# Patient Record
Sex: Male | Born: 1945
Health system: Southern US, Community
[De-identification: ages and names within clinical notes are randomized; demographics above are authoritative.]

## PROBLEM LIST (undated history)

## (undated) DIAGNOSIS — R4189 Other symptoms and signs involving cognitive functions and awareness: Secondary | ICD-10-CM

## (undated) DIAGNOSIS — I10 Essential (primary) hypertension: Secondary | ICD-10-CM

## (undated) DIAGNOSIS — I671 Cerebral aneurysm, nonruptured: Secondary | ICD-10-CM

## (undated) DIAGNOSIS — B888 Other specified infestations: Secondary | ICD-10-CM

## (undated) DIAGNOSIS — D649 Anemia, unspecified: Secondary | ICD-10-CM

## (undated) HISTORY — DX: Other specified infestations: B88.8

## (undated) HISTORY — PX: SHOULDER SURGERY: SHX246

## (undated) HISTORY — DX: Other symptoms and signs involving cognitive functions and awareness: R41.89

## (undated) HISTORY — DX: Anemia, unspecified: D64.9

## (undated) HISTORY — PX: OTHER SURGICAL HISTORY: SHX169

---

## 1998-06-21 ENCOUNTER — Inpatient Hospital Stay (HOSPITAL_COMMUNITY): Admission: EM | Admit: 1998-06-21 | Discharge: 1998-07-13 | Payer: Self-pay | Admitting: Emergency Medicine

## 1998-07-13 ENCOUNTER — Inpatient Hospital Stay (HOSPITAL_COMMUNITY)
Admission: RE | Admit: 1998-07-13 | Discharge: 1998-07-21 | Payer: Self-pay | Admitting: Physical Medicine and Rehabilitation

## 2002-01-18 ENCOUNTER — Emergency Department (HOSPITAL_COMMUNITY): Admission: EM | Admit: 2002-01-18 | Discharge: 2002-01-18 | Payer: Self-pay

## 2011-09-27 ENCOUNTER — Emergency Department (HOSPITAL_COMMUNITY)
Admission: EM | Admit: 2011-09-27 | Discharge: 2011-09-27 | Disposition: A | Discharge status: Home / Self Care | Payer: No Typology Code available for payment source | Attending: Emergency Medicine | Admitting: Emergency Medicine

## 2011-09-27 ENCOUNTER — Emergency Department (HOSPITAL_COMMUNITY): Payer: No Typology Code available for payment source

## 2011-09-27 DIAGNOSIS — I10 Essential (primary) hypertension: Secondary | ICD-10-CM | POA: Insufficient documentation

## 2011-09-27 DIAGNOSIS — M542 Cervicalgia: Secondary | ICD-10-CM | POA: Insufficient documentation

## 2011-09-27 DIAGNOSIS — Y92410 Unspecified street and highway as the place of occurrence of the external cause: Secondary | ICD-10-CM | POA: Insufficient documentation

## 2011-09-27 DIAGNOSIS — T1490XA Injury, unspecified, initial encounter: Secondary | ICD-10-CM | POA: Insufficient documentation

## 2011-09-27 DIAGNOSIS — M79609 Pain in unspecified limb: Secondary | ICD-10-CM | POA: Insufficient documentation

## 2012-05-07 ENCOUNTER — Other Ambulatory Visit: Payer: Self-pay | Admitting: Ophthalmology

## 2013-02-09 ENCOUNTER — Encounter (HOSPITAL_COMMUNITY): Payer: Self-pay | Admitting: *Deleted

## 2013-02-09 ENCOUNTER — Emergency Department (HOSPITAL_COMMUNITY)
Admission: EM | Admit: 2013-02-09 | Discharge: 2013-02-09 | Disposition: A | Discharge status: Home / Self Care | Payer: Medicare Other | Attending: Emergency Medicine | Admitting: Emergency Medicine

## 2013-02-09 DIAGNOSIS — R51 Headache: Secondary | ICD-10-CM

## 2013-02-09 DIAGNOSIS — Z79899 Other long term (current) drug therapy: Secondary | ICD-10-CM | POA: Insufficient documentation

## 2013-02-09 DIAGNOSIS — Z87898 Personal history of other specified conditions: Secondary | ICD-10-CM | POA: Insufficient documentation

## 2013-02-09 DIAGNOSIS — I10 Essential (primary) hypertension: Secondary | ICD-10-CM | POA: Insufficient documentation

## 2013-02-09 DIAGNOSIS — J321 Chronic frontal sinusitis: Secondary | ICD-10-CM

## 2013-02-09 HISTORY — DX: Essential (primary) hypertension: I10

## 2013-02-09 HISTORY — DX: Cerebral aneurysm, nonruptured: I67.1

## 2013-02-09 MED ORDER — IBUPROFEN 800 MG PO TABS: 800.0000 mg | ORAL_TABLET | Freq: Three times a day (TID) | ORAL | Status: DC

## 2013-02-09 MED ORDER — AMOXICILLIN-POT CLAVULANATE 875-125 MG PO TABS: 1.0000 | ORAL_TABLET | Freq: Two times a day (BID) | ORAL | Status: DC

## 2013-02-09 MED ORDER — PREDNISONE 20 MG PO TABS: ORAL_TABLET | ORAL | Status: DC

## 2013-02-09 MED ORDER — ACETAMINOPHEN 500 MG PO TABS
1000.0000 mg | ORAL_TABLET | Freq: Once | ORAL | Status: DC
  Filled 2013-02-09: qty 2

## 2013-02-09 MED ORDER — AMOXICILLIN-POT CLAVULANATE 875-125 MG PO TABS
1.0000 | ORAL_TABLET | Freq: Once | ORAL | Status: AC
  Administered 2013-02-09: 1 via ORAL
  Filled 2013-02-09: qty 1

## 2013-02-09 MED ORDER — IBUPROFEN 800 MG PO TABS
800.0000 mg | ORAL_TABLET | Freq: Once | ORAL | Status: AC
  Administered 2013-02-09: 800 mg via ORAL
  Filled 2013-02-09: qty 1

## 2013-02-09 NOTE — ED Provider Notes (Signed)
History     CSN: [id number]  Arrival date & time 02/09/13  1405   First MD Initiated Contact with Patient 02/09/13 1510      Chief Complaint  Patient presents with  . Headache    (Consider location/radiation/quality/duration/timing/severity/associated sxs/prior treatment) HPI.... frontal headache for 2 days with associated sinus congestion and greenish nasal discharge. severity is mild. Patient is taking nothing for it. No neuro deficits or stiff neck.  Past Medical History  Diagnosis Date  . Hypertension   . Brain aneurysm     Past Surgical History  Procedure Laterality Date  . Shunt placed in head    . Shoulder surgery      No family history on file.  History  Substance Use Topics  . Smoking status: Never Smoker   . Smokeless tobacco: Never Used  . Alcohol Use: No      Review of Systems  All other systems reviewed and are negative.    Allergies  Review of patient's allergies indicates no known allergies.  Home Medications   Current Outpatient Rx  Name  Route  Sig  Dispense  Refill  . hydrochlorothiazide (HYDRODIURIL) 25 MG tablet   Oral   Take 25 mg by mouth daily.         . quinapril (ACCUPRIL) 20 MG tablet   Oral   Take 20 mg by mouth at bedtime.         Marland Kitchen amoxicillin-clavulanate (AUGMENTIN) 875-125 MG per tablet   Oral   Take 1 tablet by mouth 2 (two) times daily. One po bid x 7 days   14 tablet   0   . ibuprofen (ADVIL,MOTRIN) 800 MG tablet   Oral   Take 1 tablet (800 mg total) by mouth 3 (three) times daily.   21 tablet   0   . predniSONE (DELTASONE) 20 MG tablet      3 tabs po day one, then 2 tabs daily x 4 days   11 tablet   0     BP 156/86  Pulse 59  Temp(Src) 98.6 F (37 C) (Oral)  Resp 18  SpO2 99%  Physical Exam  Nursing note and vitals reviewed. Constitutional: He is oriented to person, place, and time. He appears well-developed and well-nourished.  HENT:  Head: Normocephalic and atraumatic.  Tender  bilateral frontal sinuses  Eyes: Conjunctivae and EOM are normal. Pupils are equal, round, and reactive to light.  Neck: Normal range of motion. Neck supple.  Cardiovascular: Normal rate, regular rhythm and normal heart sounds.   Pulmonary/Chest: Effort normal and breath sounds normal.  Abdominal: Soft. Bowel sounds are normal.  Musculoskeletal: Normal range of motion.  Neurological: He is alert and oriented to person, place, and time.  Skin: Skin is warm and dry.  Psychiatric: He has a normal mood and affect.    ED Course  Procedures (including critical care time)  Labs Reviewed - No data to display No results found.   1. Frontal sinusitis   2. Headache       MDM  Suspect sinusitis is causing his headaches.  Rx Augmentin, ibuprofen, prednisone.        Donnetta Hutching, MD 02/09/13 1630

## 2013-02-09 NOTE — ED Notes (Signed)
Pt states started having a headache couple days ago, states has a shunt in his head and was concerned w/ headache, states also has had the "sniffles" and not sure if it's sinus pressure. Denies n/v, dizziness, blurred vision, or sensitivity to light or sound. Pt states forehead hurts and sometimes burns.

## 2013-02-09 NOTE — ED Notes (Signed)
Bed:WA17<BR> Expected date:<BR> Expected time:<BR> Means of arrival:<BR> Comments:<BR>

## 2013-06-22 ENCOUNTER — Encounter (HOSPITAL_COMMUNITY): Payer: Self-pay | Admitting: *Deleted

## 2013-06-22 ENCOUNTER — Emergency Department (HOSPITAL_COMMUNITY)
Admission: EM | Admit: 2013-06-22 | Discharge: 2013-06-22 | Disposition: A | Discharge status: Home / Self Care | Payer: Medicare Other | Source: Home / Self Care | Attending: Emergency Medicine | Admitting: Emergency Medicine

## 2013-06-22 DIAGNOSIS — W57XXXA Bitten or stung by nonvenomous insect and other nonvenomous arthropods, initial encounter: Secondary | ICD-10-CM

## 2013-06-22 DIAGNOSIS — T148 Other injury of unspecified body region: Secondary | ICD-10-CM

## 2013-06-22 MED ORDER — DOXYCYCLINE HYCLATE 100 MG PO TABS: 100.0000 mg | ORAL_TABLET | Freq: Two times a day (BID) | ORAL | Status: DC

## 2013-06-22 NOTE — ED Notes (Signed)
Pt states  He  Removed  A  Tick  From  r  Side  Bout  6  Days  Ago     Denys  Any  Fever  Or  Body  Aches   No rash other  Some   Irritation  Around  The  Bite

## 2013-06-22 NOTE — ED Provider Notes (Signed)
Chief Complaint:   Chief Complaint  Patient presents with  . Tick Removal    History of Present Illness:   Wesley Webb is a 67 year old male who was bitten by a tick on the right lateral chest about a week ago. At first he thought this was a mole. He put some alcohol on it and then a tick fell off. At the site of the tick bite the he's had some swelling and tenderness to palpation. It's mildly pruritic. He denies any purulent drainage. He's had no fever, chills, headache, stiff neck, muscle aches, joint pain, or generalized rash.  Review of Systems:  Other than noted above, the patient denies any of the following symptoms: Systemic:  No fever, chills, sweats, weight loss, or fatigue. ENT:  No nasal congestion, rhinorrhea, sore throat, swelling of lips, tongue or throat. Resp:  No cough, wheezing, or shortness of breath. Skin:  No rash, itching, nodules, or suspicious lesions.  PMFSH:  Past medical history, family history, social history, meds, and allergies were reviewed. He takes Norvasc and Accupril for high blood pressure.  Physical Exam:   Vital signs:  BP 183/89  Pulse 52  Temp(Src) 98.3 F (36.8 C) (Oral)  Resp 16  SpO2 100% Gen:  Alert, oriented, in no distress. ENT:  Pharynx clear, no intraoral lesions, moist mucous membranes. Lungs:  Clear to auscultation. Skin:  There was a 1 cm, tender nodule on the right lateral chest area. There was no purulent drainage. No visible mouthparts. Skin was otherwise clear.  Assessment:  The encounter diagnosis was Tick bite.  Appears to have mild local infection. No evidence of systemic disease.  Plan:   1.  The following meds were prescribed:   Discharge Medication List as of 06/22/2013  2:19 PM    START taking these medications   Details  doxycycline (VIBRA-TABS) 100 MG tablet Take 1 tablet (100 mg total) by mouth 2 (two) times daily., Starting 06/22/2013, Until Discontinued, Normal       2.  The patient was instructed in  symptomatic care and handouts were given. 3.  The patient was told to return if becoming worse in any way, if no better in 3 or 4 days, and given some red flag symptoms such as fever, headache, joint pain, muscle aches, or generalized rash that would indicate earlier return. 4.  Follow up here or with his primary care physician as needed.     Reuben Likes, MD 06/22/13 2034

## 2014-07-01 ENCOUNTER — Other Ambulatory Visit: Payer: Self-pay | Admitting: Family

## 2014-07-01 ENCOUNTER — Ambulatory Visit
Admission: RE | Admit: 2014-07-01 | Discharge: 2014-07-01 | Disposition: A | Discharge status: Home / Self Care | Payer: Medicare Other | Source: Ambulatory Visit | Attending: Family | Admitting: Family

## 2014-07-01 DIAGNOSIS — R109 Unspecified abdominal pain: Secondary | ICD-10-CM

## 2014-08-14 ENCOUNTER — Encounter (HOSPITAL_COMMUNITY): Payer: Self-pay | Admitting: Emergency Medicine

## 2014-08-14 ENCOUNTER — Emergency Department (HOSPITAL_COMMUNITY): Payer: Medicare Other

## 2014-08-14 ENCOUNTER — Emergency Department (HOSPITAL_COMMUNITY)
Admission: EM | Admit: 2014-08-14 | Discharge: 2014-08-14 | Disposition: A | Discharge status: Home / Self Care | Payer: Medicare Other | Attending: Emergency Medicine | Admitting: Emergency Medicine

## 2014-08-14 DIAGNOSIS — Z791 Long term (current) use of non-steroidal anti-inflammatories (NSAID): Secondary | ICD-10-CM | POA: Diagnosis not present

## 2014-08-14 DIAGNOSIS — M545 Low back pain, unspecified: Secondary | ICD-10-CM | POA: Insufficient documentation

## 2014-08-14 DIAGNOSIS — I10 Essential (primary) hypertension: Secondary | ICD-10-CM | POA: Insufficient documentation

## 2014-08-14 DIAGNOSIS — Z79899 Other long term (current) drug therapy: Secondary | ICD-10-CM | POA: Diagnosis not present

## 2014-08-14 DIAGNOSIS — IMO0002 Reserved for concepts with insufficient information to code with codable children: Secondary | ICD-10-CM | POA: Insufficient documentation

## 2014-08-14 DIAGNOSIS — Z792 Long term (current) use of antibiotics: Secondary | ICD-10-CM | POA: Diagnosis not present

## 2014-08-14 DIAGNOSIS — M5442 Lumbago with sciatica, left side: Secondary | ICD-10-CM

## 2014-08-14 LAB — URINALYSIS, ROUTINE W REFLEX MICROSCOPIC
Bilirubin Urine: NEGATIVE
Glucose, UA: NEGATIVE mg/dL
KETONES UR: NEGATIVE mg/dL
LEUKOCYTES UA: NEGATIVE
NITRITE: NEGATIVE
PROTEIN: NEGATIVE mg/dL
Specific Gravity, Urine: 1.026 (ref 1.005–1.030)
UROBILINOGEN UA: 0.2 mg/dL (ref 0.0–1.0)
pH: 5.5 (ref 5.0–8.0)

## 2014-08-14 LAB — URINE MICROSCOPIC-ADD ON

## 2014-08-14 MED ORDER — NAPROXEN 500 MG PO TABS: 500.0000 mg | ORAL_TABLET | Freq: Two times a day (BID) | ORAL | Status: DC

## 2014-08-14 MED ORDER — HYDROCODONE-ACETAMINOPHEN 5-325 MG PO TABS
1.0000 | ORAL_TABLET | Freq: Once | ORAL | Status: AC
  Administered 2014-08-14: 1 via ORAL
  Filled 2014-08-14: qty 1

## 2014-08-14 MED ORDER — DIAZEPAM 5 MG PO TABS: 5.0000 mg | ORAL_TABLET | Freq: Two times a day (BID) | ORAL | Status: DC

## 2014-08-14 NOTE — ED Provider Notes (Signed)
CSN: [id number]     Arrival date & time 08/14/14  1117 History   This chart was scribed for non-physician practitioner Wynetta EmeryNicole Kirah Stice, PA-C, working with Samuel JesterKathleen McManus, DO, by Yevette EdwardsAngela Bracken, ED Scribe. This patient was seen in room TR09C/TR09C and the patient's care was started at 12:42 PM.   First MD Initiated Contact with Patient 08/14/14 1128     Chief Complaint  Patient presents with  . Back Pain    HPI HPI Comments: Wesley Webb is a 68 y.o. male, with a h/o HTN, who presents to the Emergency Department complaining of lower left-sided back pain which has persisted for a week. He rates the pain as 7/10, and he reports the pain is increased after sitting or bearing weight.  Pain radiates down the posterior side just above the knee on the left side. Patient denies fever, numbness, weakness, paresthesia, difficulty ambulating, history of IV drug use, history of cancer, saddle anesthesia  Past Medical History  Diagnosis Date  . Hypertension   . Brain aneurysm    Past Surgical History  Procedure Laterality Date  . Shunt placed in head    . Shoulder surgery     History reviewed. No pertinent family history. History  Substance Use Topics  . Smoking status: Never Smoker   . Smokeless tobacco: Never Used  . Alcohol Use: No    Review of Systems  A complete 10 system review of systems was obtained, and all systems were negative except where indicated in the HPI and PE.    Allergies  Review of patient's allergies indicates no known allergies.  Home Medications   Prior to Admission medications   Medication Sig Start Date End Date Taking? Authorizing Provider  amoxicillin-clavulanate (AUGMENTIN) 875-125 MG per tablet Take 1 tablet by mouth 2 (two) times daily. One po bid x 7 days 02/09/13   Donnetta HutchingBrian Cook, MD  doxycycline (VIBRA-TABS) 100 MG tablet Take 1 tablet (100 mg total) by mouth 2 (two) times daily. 06/22/13   Reuben Likesavid C Keller, MD  hydrochlorothiazide (HYDRODIURIL) 25 MG  tablet Take 25 mg by mouth daily.    Historical Provider, MD  ibuprofen (ADVIL,MOTRIN) 800 MG tablet Take 1 tablet (800 mg total) by mouth 3 (three) times daily. 02/09/13   Donnetta HutchingBrian Cook, MD  predniSONE (DELTASONE) 20 MG tablet 3 tabs po day one, then 2 tabs daily x 4 days 02/09/13   Donnetta HutchingBrian Cook, MD  quinapril (ACCUPRIL) 20 MG tablet Take 20 mg by mouth at bedtime.    Historical Provider, MD   Triage Vitals: BP 142/90  Pulse 61  Temp(Src) 98.7 F (37.1 C) (Oral)  Resp 18  Ht 5\' 10"  (1.778 m)  Wt 245 lb (111.131 kg)  BMI 35.15 kg/m2  SpO2 97%  Physical Exam  Nursing note and vitals reviewed. Constitutional: He appears well-developed and well-nourished.  HENT:  Head: Normocephalic.  Eyes: Conjunctivae are normal.  Neck: Normal range of motion.  Cardiovascular: Normal rate, regular rhythm and intact distal pulses.   Pulmonary/Chest: Effort normal.  Abdominal: Soft. There is no tenderness.  Genitourinary:  No CVA tenderness palpation bilaterally.  Neurological: He is alert.  No point tenderness to percussion of lumbar spinal processes.  No TTP or paraspinal muscular spasm. Strength is 5 out of 5 to bilateral lower extremities at hip and knee; extensor hallucis longus 5 out of 5. Ankle strength 5 out of 5, no clonus, neurovascularly intact. No saddle anaesthesia. Patellar reflexes are 2+ bilaterally.     Straight leg  raise is negative bilaterally  Psychiatric: He has a normal mood and affect.    ED Course  Procedures (including critical care time)  DIAGNOSTIC STUDIES: Oxygen Saturation is 97% on room air, normal by my interpretation.    COORDINATION OF CARE:  12:42 PM- Discussed treatment plan with patient, and the patient agreed to the plan.   Labs Review Labs Reviewed  URINALYSIS, ROUTINE W REFLEX MICROSCOPIC    Imaging Review Dg Lumbar Spine Complete  08/14/2014   CLINICAL DATA:  Chronic back pain  EXAM: LUMBAR SPINE - COMPLETE 4+ VIEW  COMPARISON:  None.  FINDINGS:  Osseous demineralization. Five lumbar vertebrae.  Mild multilevel disk space narrowing and endplate spur formation.  Vertebral body heights maintained.  Limbus vertebra at L4.  No acute fracture, subluxation, or bone destruction.  No spondylolysis.  SI joints symmetric.  Tube projects over the LEFT mid abdomen.  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  No acute abnormalities.   Electronically Signed   By: Ulyses Southward M.D.   On: 08/14/2014 13:08     EKG Interpretation None      MDM   Final diagnoses:  None   Filed Vitals:   08/14/14 1122 08/14/14 1353  BP: 142/90 131/76  Pulse: 61 60  Temp: 98.7 F (37.1 C) 98.9 F (37.2 C)  TempSrc: Oral Oral  Resp: 18 18  Height: 5\' 10"  (1.778 m)   Weight: 245 lb (111.131 kg)   SpO2: 97% 99%    Medications  HYDROcodone-acetaminophen (NORCO/VICODIN) 5-325 MG per tablet 1 tablet (1 tablet Oral Given 08/14/14 1315)    Wesley Webb is a 68 y.o. male presenting with  back pain.  No neurological deficits and normal neuro exam. X-ray with no acute abnormalities, urinalysis is not consistent with infection. Patient can walk but states is painful.  No loss of bowel or bladder control.  No concern for cauda equina.  No fever, night sweats, weight loss, h/o cancer, IVDU.  RICE protocol and pain medicine indicated and discussed with patient.  Evaluation does not show pathology that would require ongoing emergent intervention or inpatient treatment. Pt is hemodynamically stable and mentating appropriately. Discussed findings and plan with patient/guardian, who agrees with care plan. All questions answered. Return precautions discussed and outpatient follow up given.   Discharge Medication List as of 08/14/2014  1:46 PM    START taking these medications   Details  diazepam (VALIUM) 5 MG tablet Take 1 tablet (5 mg total) by mouth 2 (two) times daily., Starting 08/14/2014, Until Discontinued, Print    naproxen  (NAPROSYN) 500 MG tablet Take 1 tablet (500 mg total) by mouth 2 (two) times daily with a meal., Starting 08/14/2014, Until Discontinued, Print        I personally performed the services described in this documentation, which was scribed in my presence. The recorded information has been reviewed and is accurate.    Wynetta Emery, PA-C 08/15/14 1007

## 2014-08-14 NOTE — ED Notes (Signed)
Per pt sts lower left sided back pain x 1 week. sts worse after sitting and bearing weight.

## 2014-08-14 NOTE — Discharge Instructions (Signed)
For pain control you may take up to naproxen 2 times a day with meals.  Take valium for breakthrough pain, do not drink alcohol, drive, care for children or do other critical tasks while taking valium.  Please be very careful not to fall! The pain medication  puts you at risk for falls. Please rest as much as possible and try to not stay alone.   Please follow with your primary care doctor in the next 2 days for a check-up. They must obtain records for further management.   Do not hesitate to return to the Emergency Department for any new, worsening or concerning symptoms.    Back Pain, Adult Back pain is very common. The pain often gets better over time. The cause of back pain is usually not dangerous. Most people can learn to manage their back pain on their own.  HOME CARE   Stay active. Start with short walks on flat ground if you can. Try to walk farther each day.  Do not sit, drive, or stand in one place for more than 30 minutes. Do not stay in bed.  Do not avoid exercise or work. Activity can help your back heal faster.  Be careful when you bend or lift an object. Bend at your knees, keep the object close to you, and do not twist.  Sleep on a firm mattress. Lie on your side, and bend your knees. If you lie on your back, put a pillow under your knees.  Only take medicines as told by your doctor.  Put ice on the injured area.  Put ice in a plastic bag.  Place a towel between your skin and the bag.  Leave the ice on for 15-20 minutes, 03-04 times a day for the first 2 to 3 days. After that, you can switch between ice and heat packs.  Ask your doctor about back exercises or massage.  Avoid feeling anxious or stressed. Find good ways to deal with stress, such as exercise. GET HELP RIGHT AWAY IF:   Your pain does not go away with rest or medicine.  Your pain does not go away in 1 week.  You have new problems.  You do not feel well.  The pain spreads into your  legs.  You cannot control when you poop (bowel movement) or pee (urinate).  Your arms or legs feel weak or lose feeling (numbness).  You feel sick to your stomach (nauseous) or throw up (vomit).  You have belly (abdominal) pain.  You feel like you may pass out (faint). MAKE SURE YOU:   Understand these instructions.  Will watch your condition.  Will get help right away if you are not doing well or get worse. Document Released: 05/30/2008 Document Revised: 03/05/2012 Document Reviewed: 04/15/2014 [facility] Patient Information 2015 [employer] [place]. This information is not intended to replace advice given to you by your health care provider. Make sure you discuss any questions you have with your health care provider.

## 2014-08-16 NOTE — ED Provider Notes (Signed)
Medical screening examination/treatment/procedure(s) were performed by non-physician practitioner and as supervising physician I was immediately available for consultation/collaboration.   EKG Interpretation None        Jermie Hippe, DO 08/16/14 1555 

## 2014-08-18 ENCOUNTER — Other Ambulatory Visit: Payer: Self-pay | Admitting: Family

## 2014-08-18 DIAGNOSIS — I158 Other secondary hypertension: Secondary | ICD-10-CM

## 2014-08-18 DIAGNOSIS — R1084 Generalized abdominal pain: Secondary | ICD-10-CM

## 2015-02-10 DIAGNOSIS — M5432 Sciatica, left side: Secondary | ICD-10-CM | POA: Diagnosis not present

## 2015-02-10 DIAGNOSIS — I10 Essential (primary) hypertension: Secondary | ICD-10-CM | POA: Diagnosis not present

## 2015-02-10 DIAGNOSIS — Z79899 Other long term (current) drug therapy: Secondary | ICD-10-CM | POA: Diagnosis not present

## 2016-06-16 IMAGING — CR DG LUMBAR SPINE COMPLETE 4+V
5 series · 5 of 5 positions shown · non-contrast
Comparison: None.

CLINICAL DATA: Chronic back pain

EXAM:
LUMBAR SPINE - COMPLETE 4+ VIEW

[t l-spine a.p.]
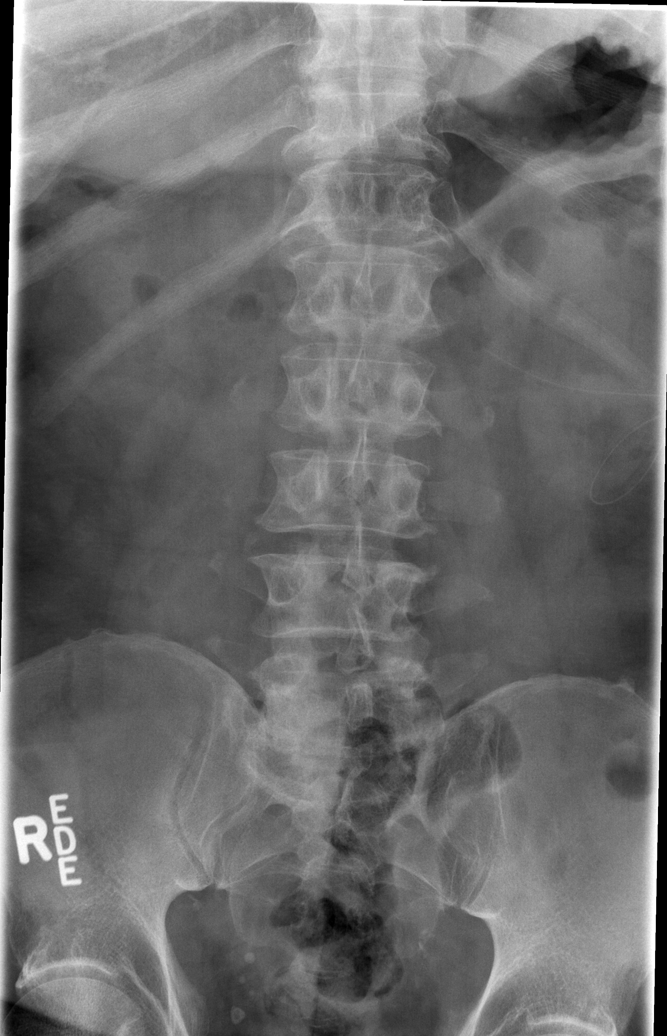

[t l-spine oblique exposure (1 of 2)]
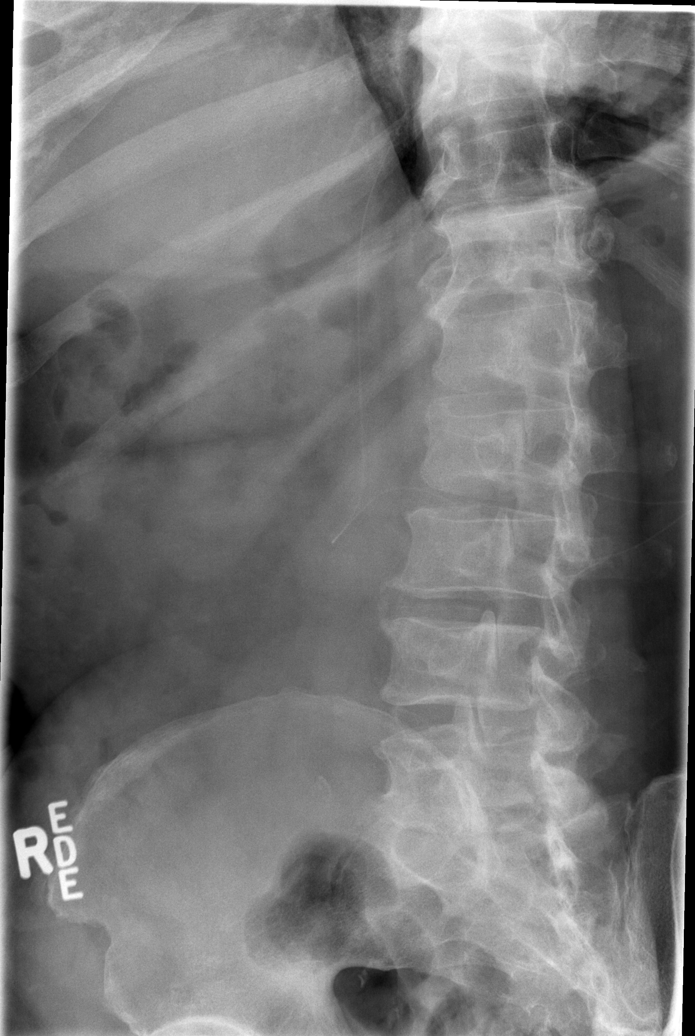

[t l-spine oblique exposure (2 of 2)]
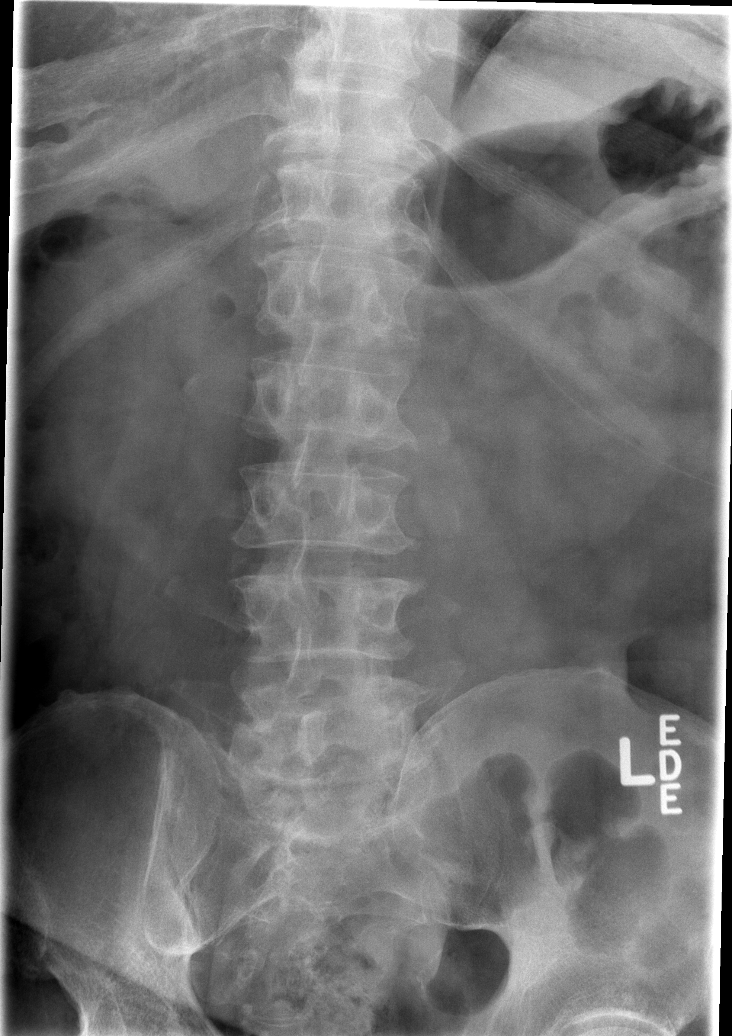

[t l-spine lat]
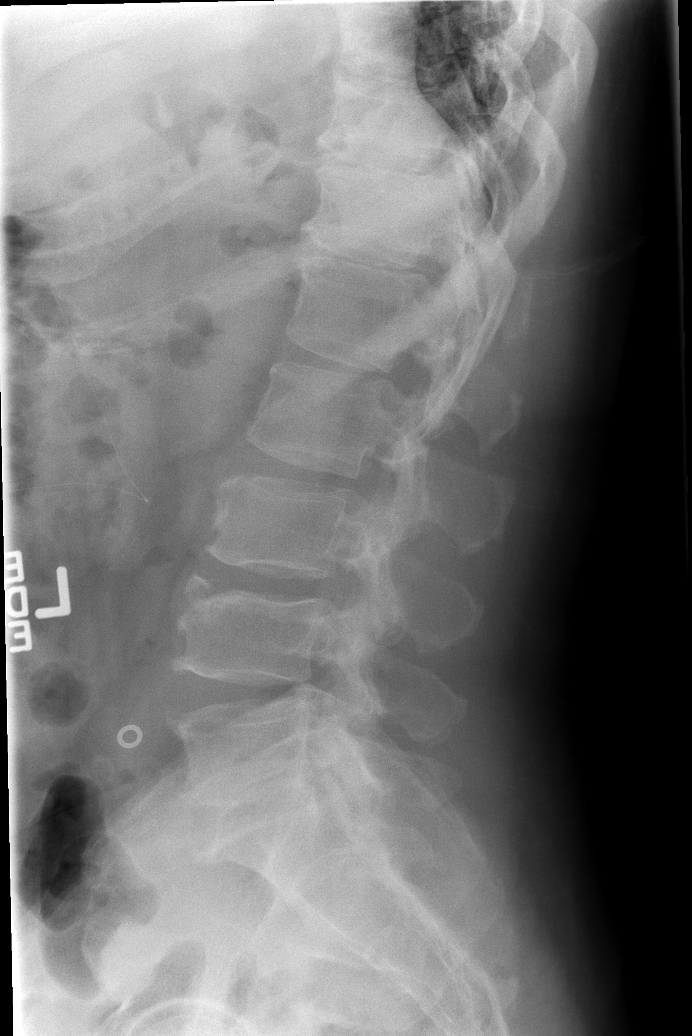

[t l-spine l5-s1 spot]
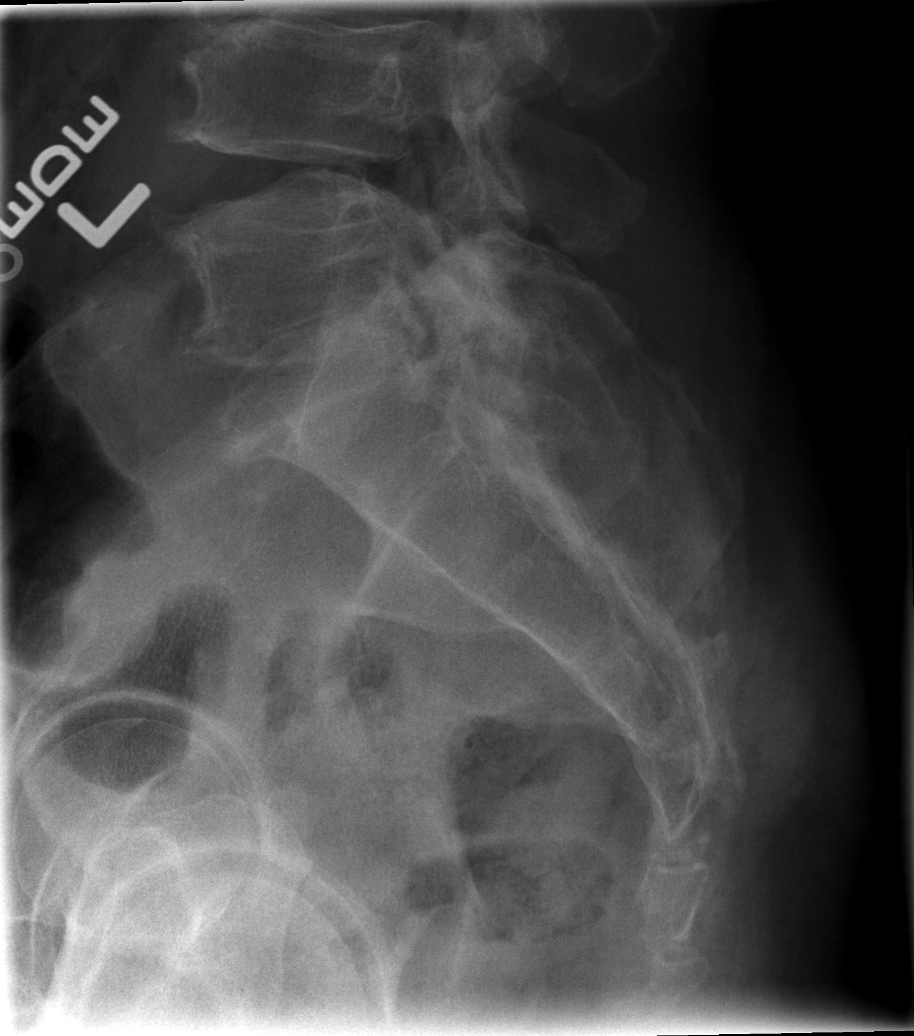

[5 of 5 positions shown; findings below may reference images not displayed]

FINDINGS: Osseous demineralization.
Five lumbar vertebrae.

Mild multilevel disk space narrowing and endplate spur formation.

Vertebral body heights maintained.

Limbus vertebra at L4.

No acute fracture, subluxation, or bone destruction.

No spondylolysis.

SI joints symmetric.

Tube projects over the LEFT mid abdomen.
IMPRESSION: Degenerative disc disease changes lumbar spine with limbus vertebra
at L4.

No acute abnormalities.

## 2017-05-05 DIAGNOSIS — R319 Hematuria, unspecified: Secondary | ICD-10-CM | POA: Diagnosis not present

## 2017-05-05 DIAGNOSIS — I10 Essential (primary) hypertension: Secondary | ICD-10-CM | POA: Diagnosis not present

## 2017-05-05 DIAGNOSIS — M940 Chondrocostal junction syndrome [Tietze]: Secondary | ICD-10-CM | POA: Diagnosis not present

## 2017-05-05 DIAGNOSIS — E782 Mixed hyperlipidemia: Secondary | ICD-10-CM | POA: Diagnosis not present

## 2017-05-16 DIAGNOSIS — H50111 Monocular exotropia, right eye: Secondary | ICD-10-CM | POA: Diagnosis not present

## 2017-05-16 DIAGNOSIS — H401133 Primary open-angle glaucoma, bilateral, severe stage: Secondary | ICD-10-CM | POA: Diagnosis not present

## 2017-09-12 DIAGNOSIS — H47233 Glaucomatous optic atrophy, bilateral: Secondary | ICD-10-CM | POA: Diagnosis not present

## 2017-10-04 DIAGNOSIS — Z0000 Encounter for general adult medical examination without abnormal findings: Secondary | ICD-10-CM | POA: Diagnosis not present

## 2017-10-04 DIAGNOSIS — Z125 Encounter for screening for malignant neoplasm of prostate: Secondary | ICD-10-CM | POA: Diagnosis not present

## 2017-10-04 DIAGNOSIS — I10 Essential (primary) hypertension: Secondary | ICD-10-CM | POA: Diagnosis not present

## 2017-10-04 DIAGNOSIS — M545 Low back pain: Secondary | ICD-10-CM | POA: Diagnosis not present

## 2017-10-04 DIAGNOSIS — E782 Mixed hyperlipidemia: Secondary | ICD-10-CM | POA: Diagnosis not present

## 2017-10-04 DIAGNOSIS — Z1211 Encounter for screening for malignant neoplasm of colon: Secondary | ICD-10-CM | POA: Diagnosis not present

## 2018-01-17 DIAGNOSIS — H401133 Primary open-angle glaucoma, bilateral, severe stage: Secondary | ICD-10-CM | POA: Diagnosis not present

## 2018-04-18 DIAGNOSIS — H47233 Glaucomatous optic atrophy, bilateral: Secondary | ICD-10-CM | POA: Diagnosis not present

## 2018-07-09 LAB — CBC AND DIFFERENTIAL
HEMATOCRIT: 38 — AB (ref 41–53)
WBC: 6.4

## 2018-07-09 LAB — LIPID PANEL
Cholesterol: 191 (ref 0–200)
HDL: 37 (ref 35–70)
LDL CALC: 92
LDL/HDL RATIO: 2.5
TRIGLYCERIDES: 308 — AB (ref 40–160)

## 2018-07-09 LAB — HEPATIC FUNCTION PANEL
ALT: 26 (ref 10–40)
AST: 20 (ref 14–40)
Alkaline Phosphatase: 54 (ref 25–125)

## 2018-07-09 LAB — BASIC METABOLIC PANEL
BUN: 14 (ref 4–21)
CREATININE: 1.5 — AB (ref 0.6–1.3)
Glucose: 134
Potassium: 4.3 (ref 3.4–5.3)
SODIUM: 141 (ref 137–147)

## 2018-09-17 ENCOUNTER — Encounter: Payer: Self-pay | Admitting: Nurse Practitioner

## 2018-09-17 DIAGNOSIS — I10 Essential (primary) hypertension: Secondary | ICD-10-CM

## 2018-09-17 DIAGNOSIS — E782 Mixed hyperlipidemia: Secondary | ICD-10-CM

## 2018-10-11 ENCOUNTER — Encounter: Payer: Self-pay | Admitting: Nurse Practitioner

## 2018-10-11 ENCOUNTER — Ambulatory Visit: Payer: Self-pay

## 2018-11-23 ENCOUNTER — Other Ambulatory Visit: Payer: Self-pay

## 2018-11-23 NOTE — Patient Outreach (Signed)
[facility] Care Management  11/23/2018  Shannan HarperFreddie L Mccraw 01/01/1946 [id number]   Medication Adherence call to Mr. Wesley ManlyFreddie Whitehair left a message with patient's wife she ask to call back after 6pm. patient is due on Quanipril 20 mg. Mr. Thamas JaegersLennon is showing past due under [employer].   Lillia AbedAna Ollison-Moran CPhT Pharmacy Technician [facility] Direct Dial [phone]  Fax [phone] [email or web address]

## 2018-12-31 ENCOUNTER — Telehealth: Payer: Self-pay

## 2018-12-31 NOTE — Telephone Encounter (Signed)
Called pt to schedule AWV-S.  Wife states he is not at home.  Left message to call Misty Stanley at [phone].  Last awv 10/04/17

## 2019-01-02 ENCOUNTER — Other Ambulatory Visit: Payer: Self-pay | Admitting: Nurse Practitioner

## 2019-01-04 ENCOUNTER — Other Ambulatory Visit: Payer: Self-pay

## 2019-01-04 MED ORDER — AMLODIPINE BESYLATE 10 MG PO TABS: 10.0000 mg | ORAL_TABLET | Freq: Every day | ORAL | 0 refills | Status: DC

## 2019-02-07 NOTE — Telephone Encounter (Signed)
Called on Home phone # and Mobile# to schedule Medicare Annual Wellness Visit with the Nurse Health Advisor. No answer.  Left message on Mobile voicemail, couldn't leave a message at Home Phone number.    If patient returns call, please note: their last AWV was on 10/04/2017,18 please schedule AWV with NHA any date AFTER 10/04/2018.  Thank you! For any questions please contact: Trixie Rude, Care Guide at [phone] or Skype [email or web address]

## 2019-02-27 ENCOUNTER — Telehealth: Payer: Self-pay

## 2019-02-27 NOTE — Telephone Encounter (Signed)
Spoke with patient and scheduled his Medicare Annual Wellness Visit for 03/14/2019 at 9:15 am with NHA, AND an Office Visit at 10:00 am with Clarisse Gouge.  Last date of service seen per [employer] was 10/04/2017.  Patient states he is still seeing [facility] as his PCP.  Please note his Last AWV was 10/04/2017, per [employer] and [facility].  Trixie Rude, Care Guide.

## 2019-02-27 NOTE — Telephone Encounter (Signed)
Called patient to see if he is still seeing [facility] as his PCP, and to schedule Medicare Annual Wellness Visit with the Nurse Health Advisor. Per [employer], patient's last DOS was 10/04/2017.  Patient states he is still seeing [facility] as his PCP.  Patient scheduled his Medicare Annual Wellness Visit with NHA on 03/14/2019 at 9:15 am, and Office Visit at 10:00 am with Clarisse Gouge. Thank you! For any questions please contact: Trixie Rude at [phone] or Skype [email or web address]

## 2019-03-06 ENCOUNTER — Other Ambulatory Visit: Payer: Self-pay | Admitting: Nurse Practitioner

## 2019-03-13 ENCOUNTER — Telehealth: Payer: Self-pay | Admitting: Nurse Practitioner

## 2019-03-13 NOTE — Telephone Encounter (Signed)
PT LVM WANTING TO VERIFY NEXT APPT NO ANS LVM ADV PT OF APPT INFO FOR 3/19@915 

## 2019-03-14 ENCOUNTER — Other Ambulatory Visit: Payer: Self-pay

## 2019-03-14 ENCOUNTER — Ambulatory Visit (INDEPENDENT_AMBULATORY_CARE_PROVIDER_SITE_OTHER): Payer: Medicare Other

## 2019-03-14 ENCOUNTER — Encounter: Payer: Self-pay | Admitting: Internal Medicine

## 2019-03-14 ENCOUNTER — Other Ambulatory Visit: Payer: Self-pay | Admitting: Internal Medicine

## 2019-03-14 ENCOUNTER — Ambulatory Visit (INDEPENDENT_AMBULATORY_CARE_PROVIDER_SITE_OTHER): Payer: Medicare Other | Admitting: Internal Medicine

## 2019-03-14 VITALS — BP 132/74 | HR 64 | Temp 98.3°F | Ht 67.8 in | Wt 234.6 lb

## 2019-03-14 DIAGNOSIS — Z0000 Encounter for general adult medical examination without abnormal findings: Secondary | ICD-10-CM

## 2019-03-14 DIAGNOSIS — I10 Essential (primary) hypertension: Secondary | ICD-10-CM

## 2019-03-14 DIAGNOSIS — E782 Mixed hyperlipidemia: Secondary | ICD-10-CM | POA: Diagnosis not present

## 2019-03-14 DIAGNOSIS — R81 Glycosuria: Secondary | ICD-10-CM

## 2019-03-14 DIAGNOSIS — R631 Polydipsia: Secondary | ICD-10-CM | POA: Diagnosis not present

## 2019-03-14 LAB — POCT URINALYSIS DIPSTICK
Bilirubin, UA: NEGATIVE
Blood, UA: NEGATIVE
GLUCOSE UA: POSITIVE — AB
KETONES UA: NEGATIVE
LEUKOCYTES UA: NEGATIVE
NITRITE UA: NEGATIVE
PROTEIN UA: NEGATIVE
Spec Grav, UA: 1.025 (ref 1.010–1.025)
Urobilinogen, UA: 0.2 E.U./dL
pH, UA: 5 (ref 5.0–8.0)

## 2019-03-14 NOTE — Progress Notes (Addendum)
Subjective:     Patient ID: Wesley Webb , male    DOB: [birth year] , 73 y.o.   MRN: [id number]   Chief Complaint  Patient presents with  . Hypertension    HPI  Pt is here for HTN FU. He rarely check his BP. Would like his L ear checked, sometimes feels there's something in it.     Past Medical History:  Diagnosis Date  . Brain aneurysm   . Hypertension      Family History  Problem Relation Age of Onset  . Glaucoma Mother      Current Outpatient Medications:  .  amLODipine (NORVASC) 10 MG tablet, Take 1 tablet (10 mg total) by mouth daily. Patient needs an appointment, Disp: 30 tablet, Rfl: 0 .  amoxicillin-clavulanate (AUGMENTIN) 875-125 MG per tablet, Take 1 tablet by mouth 2 (two) times daily. One po bid x 7 days (Patient not taking: Reported on 03/14/2019), Disp: 14 tablet, Rfl: 0 .  COMBIGAN 0.2-0.5 % ophthalmic solution, , Disp: , Rfl:  .  cyclobenzaprine (FLEXERIL) 5 MG tablet, Take 5 mg by mouth 3 (three) times daily as needed for muscle spasms., Disp: , Rfl:  .  diazepam (VALIUM) 5 MG tablet, Take 1 tablet (5 mg total) by mouth 2 (two) times daily. (Patient not taking: Reported on 03/14/2019), Disp: 10 tablet, Rfl: 0 .  doxycycline (VIBRA-TABS) 100 MG tablet, Take 1 tablet (100 mg total) by mouth 2 (two) times daily. (Patient not taking: Reported on 03/14/2019), Disp: 20 tablet, Rfl: 0 .  hydrochlorothiazide (HYDRODIURIL) 25 MG tablet, Take 25 mg by mouth daily., Disp: , Rfl:  .  hydrocortisone cream 1 %, Apply 1 application topically 2 (two) times daily., Disp: , Rfl:  .  ibuprofen (ADVIL,MOTRIN) 800 MG tablet, Take 1 tablet (800 mg total) by mouth 3 (three) times daily., Disp: 21 tablet, Rfl: 0 .  LUMIGAN 0.01 % SOLN, INSTILL 1 DROP INTO EACH EYE EVERY EVENING, Disp: , Rfl:  .  naproxen (NAPROSYN) 500 MG tablet, Take 1 tablet (500 mg total) by mouth 2 (two) times daily with a meal., Disp: 30 tablet, Rfl: 0 .  predniSONE (DELTASONE) 20 MG tablet, 3 tabs po day one,  then 2 tabs daily x 4 days (Patient not taking: Reported on 03/14/2019), Disp: 11 tablet, Rfl: 0 .  quinapril (ACCUPRIL) 20 MG tablet, Take 20 mg by mouth at bedtime., Disp: , Rfl:  .  Testosterone 20.25 MG/ACT (1.62%) GEL, , Disp: , Rfl:    No Known Allergies   Review of Systems  Constitutional: Negative for diaphoresis.  HENT: Negative for congestion, ear discharge, ear pain and sore throat.   Respiratory: Negative for cough, chest tightness and shortness of breath.   Cardiovascular: Negative for chest pain, palpitations and leg swelling.  Gastrointestinal: Negative for constipation, diarrhea and nausea.  Endocrine: Positive for polydipsia.       Admits of eating a lot of sweets, cookies or candy, but plans to stop today  Genitourinary: Negative for dysuria.  Musculoskeletal: Negative for arthralgias.  Skin: Negative for rash.  Neurological: Negative for weakness and headaches.     Today's Vitals   03/14/19 0955  BP: 132/74  Pulse: 64  Temp: 98.3 F (36.8 C)  TempSrc: Oral  Weight: 234 lb 9.6 oz (106.4 kg)  Height: 5' 7.8" (1.722 m)   Body mass index is 35.88 kg/m.   Objective:  Physical Exam  Constitutional: She is oriented to person, place, and time. She appears well-developed  and well-nourished. No distress.  HENT:  Head: Normocephalic and atraumatic.  Right Ear: External ear normal.  Left Ear: External ear normal.  Nose: Nose normal.  Eyes: Conjunctivae are normal. Right eye exhibits no discharge. Left eye exhibits no discharge. No scleral icterus.  Neck: Neck supple. No thyromegaly present.  No carotid bruits bilaterally  Cardiovascular: Normal rate and regular rhythm.  No murmur heard. Pulmonary/Chest: Effort normal and breath sounds normal. No respiratory distress.  Musculoskeletal: Normal range of motion. She exhibits no edema.  Lymphadenopathy:    She has no cervical adenopathy.  Neurological: She is alert and oriented to person, place, and time.  Skin:  Skin is warm and dry. Capillary refill takes less than 2 seconds. No rash noted. She is not diaphoretic.  Psychiatric: She has a normal mood and affect. Her behavior is normal. Judgment and thought content normal.  Nursing note reviewed. UA- shows + 1 glucose.  Assessment And Plan:    .1. Essential hypertension- stable. May continue same meds.  - CMP14 + Anion Gap - CBC no Diff  2. Mixed hyperlipidemia- chronic - Lipid Profile  3. Polydipsia- new.  - Hemoglobin A1c 4- Glycosuria- new. HGBA1C ordered. Advised to take off sweets and gradually cut them out of his diet. Explained that fruits also have sucrose, so this can raise his blood sugar.    FU with Janece in 3-4 months.   Auri Jahnke RODRIGUEZ-SOUTHWORTH, PA-C

## 2019-03-14 NOTE — Patient Instructions (Signed)
Wesley Webb , Thank you for taking time to come for your Medicare Wellness Visit. I appreciate your ongoing commitment to your health goals. Please review the following plan we discussed and let me know if I can assist you in the future.   Screening recommendations/referrals: Colonoscopy: declines Recommended yearly ophthalmology/optometry visit for glaucoma screening and checkup Recommended yearly dental visit for hygiene and checkup  Vaccinations: Influenza vaccine: declines Pneumococcal vaccine: declines Tdap vaccine: declines Shingles vaccine: declines    Advanced directives: Advance directive discussed with you today. Even though you declined this today please call our office should you change your mind and we can give you the proper paperwork for you to fill out.   Conditions/risks identified: Obesbity  Next appointment: 06/14/2019 at 930  Preventive Care 65 Years and Older, Male Preventive care refers to lifestyle choices and visits with your health care provider that can promote health and wellness. What does preventive care include?  A yearly physical exam. This is also called an annual well check.  Dental exams once or twice a year.  Routine eye exams. Ask your health care provider how often you should have your eyes checked.  Personal lifestyle choices, including:  Daily care of your teeth and gums.  Regular physical activity.  Eating a healthy diet.  Avoiding tobacco and drug use.  Limiting alcohol use.  Practicing safe sex.  Taking low doses of aspirin every day.  Taking vitamin and mineral supplements as recommended by your health care provider. What happens during an annual well check? The services and screenings done by your health care provider during your annual well check will depend on your age, overall health, lifestyle risk factors, and family history of disease. Counseling  Your health care provider may ask you questions about your:  Alcohol  use.  Tobacco use.  Drug use.  Emotional well-being.  Home and relationship well-being.  Sexual activity.  Eating habits.  History of falls.  Memory and ability to understand (cognition).  Work and work Astronomer. Screening  You may have the following tests or measurements:  Height, weight, and BMI.  Blood pressure.  Lipid and cholesterol levels. These may be checked every 5 years, or more frequently if you are over 65 years old.  Skin check.  Lung cancer screening. You may have this screening every year starting at age 21 if you have a 30-pack-year history of smoking and currently smoke or have quit within the past 15 years.  Fecal occult blood test (FOBT) of the stool. You may have this test every year starting at age 30.  Flexible sigmoidoscopy or colonoscopy. You may have a sigmoidoscopy every 5 years or a colonoscopy every 10 years starting at age 76.  Prostate cancer screening. Recommendations will vary depending on your family history and other risks.  Hepatitis C blood test.  Hepatitis B blood test.  Sexually transmitted disease (STD) testing.  Diabetes screening. This is done by checking your blood sugar (glucose) after you have not eaten for a while (fasting). You may have this done every 1-3 years.  Abdominal aortic aneurysm (AAA) screening. You may need this if you are a current or former smoker.  Osteoporosis. You may be screened starting at age 107 if you are at high risk. Talk with your health care provider about your test results, treatment options, and if necessary, the need for more tests. Vaccines  Your health care provider may recommend certain vaccines, such as:  Influenza vaccine. This is recommended every year.  Tetanus, diphtheria, and acellular pertussis (Tdap, Td) vaccine. You may need a Td booster every 10 years.  Zoster vaccine. You may need this after age 31.  Pneumococcal 13-valent conjugate (PCV13) vaccine. One dose is  recommended after age 15.  Pneumococcal polysaccharide (PPSV23) vaccine. One dose is recommended after age 79. Talk to your health care provider about which screenings and vaccines you need and how often you need them. This information is not intended to replace advice given to you by your health care provider. Make sure you discuss any questions you have with your health care provider. Document Released: 01/08/2016 Document Revised: 08/31/2016 Document Reviewed: 10/13/2015 Elsevier Interactive Patient Education  2017 [place] Prevention in the Home Falls can cause injuries. They can happen to people of all ages. There are many things you can do to make your home safe and to help prevent falls. What can I do on the outside of my home?  Regularly fix the edges of walkways and driveways and fix any cracks.  Remove anything that might make you trip as you walk through a door, such as a raised step or threshold.  Trim any bushes or trees on the path to your home.  Use bright outdoor lighting.  Clear any walking paths of anything that might make someone trip, such as rocks or tools.  Regularly check to see if handrails are loose or broken. Make sure that both sides of any steps have handrails.  Any raised decks and porches should have guardrails on the edges.  Have any leaves, snow, or ice cleared regularly.  Use sand or salt on walking paths during winter.  Clean up any spills in your garage right away. This includes oil or grease spills. What can I do in the bathroom?  Use night lights.  Install grab bars by the toilet and in the tub and shower. Do not use towel bars as grab bars.  Use non-skid mats or decals in the tub or shower.  If you need to sit down in the shower, use a plastic, non-slip stool.  Keep the floor dry. Clean up any water that spills on the floor as soon as it happens.  Remove soap buildup in the tub or shower regularly.  Attach bath mats  securely with double-sided non-slip rug tape.  Do not have throw rugs and other things on the floor that can make you trip. What can I do in the bedroom?  Use night lights.  Make sure that you have a light by your bed that is easy to reach.  Do not use any sheets or blankets that are too big for your bed. They should not hang down onto the floor.  Have a firm chair that has side arms. You can use this for support while you get dressed.  Do not have throw rugs and other things on the floor that can make you trip. What can I do in the kitchen?  Clean up any spills right away.  Avoid walking on wet floors.  Keep items that you use a lot in easy-to-reach places.  If you need to reach something above you, use a strong step stool that has a grab bar.  Keep electrical cords out of the way.  Do not use floor polish or wax that makes floors slippery. If you must use wax, use non-skid floor wax.  Do not have throw rugs and other things on the floor that can make you trip. What can I do  with my stairs?  Do not leave any items on the stairs.  Make sure that there are handrails on both sides of the stairs and use them. Fix handrails that are broken or loose. Make sure that handrails are as long as the stairways.  Check any carpeting to make sure that it is firmly attached to the stairs. Fix any carpet that is loose or worn.  Avoid having throw rugs at the top or bottom of the stairs. If you do have throw rugs, attach them to the floor with carpet tape.  Make sure that you have a light switch at the top of the stairs and the bottom of the stairs. If you do not have them, ask someone to add them for you. What else can I do to help prevent falls?  Wear shoes that:  Do not have high heels.  Have rubber bottoms.  Are comfortable and fit you well.  Are closed at the toe. Do not wear sandals.  If you use a stepladder:  Make sure that it is fully opened. Do not climb a closed  stepladder.  Make sure that both sides of the stepladder are locked into place.  Ask someone to hold it for you, if possible.  Clearly mark and make sure that you can see:  Any grab bars or handrails.  First and last steps.  Where the edge of each step is.  Use tools that help you move around (mobility aids) if they are needed. These include:  Canes.  Walkers.  Scooters.  Crutches.  Turn on the lights when you go into a dark area. Replace any light bulbs as soon as they burn out.  Set up your furniture so you have a clear path. Avoid moving your furniture around.  If any of your floors are uneven, fix them.  If there are any pets around you, be aware of where they are.  Review your medicines with your doctor. Some medicines can make you feel dizzy. This can increase your chance of falling. Ask your doctor what other things that you can do to help prevent falls. This information is not intended to replace advice given to you by your health care provider. Make sure you discuss any questions you have with your health care provider. Document Released: 10/08/2009 Document Revised: 05/19/2016 Document Reviewed: 01/16/2015 Elsevier Interactive Patient Education  2017 [employer].

## 2019-03-14 NOTE — Progress Notes (Signed)
Subjective:   Wesley Webb is a 73 y.o. male who presents for Medicare Annual/Subsequent preventive examination.  Review of Systems:  n/a Cardiac Risk Factors include: male gender;hypertension;sedentary lifestyle;obesity (BMI >30kg/m2);advanced age (>69men, >50 women)     Objective:    Vitals: BP 132/74 (BP Location: Left Arm, Patient Position: Sitting)    Pulse 64    Temp 98.3 F (36.8 C) (Oral)    Ht 5' 7.8" (1.722 m)    Wt 234 lb 9.6 oz (106.4 kg)    SpO2 98%    BMI 35.88 kg/m   Body mass index is 35.88 kg/m.  Advanced Directives 03/14/2019  Does Patient Have a Medical Advance Directive? No  Would patient like information on creating a medical advance directive? No - Patient declined    Tobacco Social History   Tobacco Use  Smoking Status Never Smoker  Smokeless Tobacco Never Used     Counseling given: Not Answered   Clinical Intake:  Pre-visit preparation completed: Yes  Pain : No/denies pain Pain Score: 0-No pain     Nutritional Status: BMI > 30  Obese Nutritional Risks: None Diabetes: No  How often do you need to have someone help you when you read instructions, pamphlets, or other written materials from your doctor or pharmacy?: 1 - Never What is the last grade level you completed in school?: 11th grade, [employer]  Interpreter Needed?: No  Information entered by :: NAllen LPN  Past Medical History:  Diagnosis Date   Brain aneurysm    Hypertension    Past Surgical History:  Procedure Laterality Date   SHOULDER SURGERY     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     Family History  Problem Relation Age of Onset   Glaucoma Mother    Social History   Socioeconomic History   Marital status: Married    Spouse name: Not on file   Number of children: Not on file   Years of education: Not on file   Highest education level: Not on file  Occupational History   Occupation: works part time Ecologist strain: Not hard at  all   Food insecurity:    Worry: Never true    Inability: Never true   Transportation needs:    Medical: No    Non-medical: No  Tobacco Use   Smoking status: Never Smoker   Smokeless tobacco: Never Used  Substance and Sexual Activity   Alcohol use: No   Drug use: No   Sexual activity: Yes  Lifestyle   Physical activity:    Days per week: 0 days    Minutes per session: 0 min   Stress: To some extent  Relationships   Social connections:    Talks on phone: Not on file    Gets together: Not on file    Attends religious service: Not on file    Active member of club or organization: Not on file    Attends meetings of clubs or organizations: Not on file    Relationship status: Not on file  Other Topics Concern   Not on file  Social History Narrative   Not on file    Outpatient Encounter Medications as of 03/14/2019  Medication Sig   amLODipine (NORVASC) 10 MG tablet Take 1 tablet (10 mg total) by mouth daily. Patient needs an appointment   cyclobenzaprine (FLEXERIL) 5 MG tablet Take 5 mg by mouth 3 (three) times daily as needed for muscle spasms.  hydrocortisone cream 1 % Apply 1 application topically 2 (two) times daily.   ibuprofen (ADVIL,MOTRIN) 800 MG tablet Take 1 tablet (800 mg total) by mouth 3 (three) times daily.   naproxen (NAPROSYN) 500 MG tablet Take 1 tablet (500 mg total) by mouth 2 (two) times daily with a meal.   quinapril (ACCUPRIL) 20 MG tablet Take 20 mg by mouth at bedtime.   amoxicillin-clavulanate (AUGMENTIN) 875-125 MG per tablet Take 1 tablet by mouth 2 (two) times daily. One po bid x 7 days (Patient not taking: Reported on 03/14/2019)   COMBIGAN 0.2-0.5 % ophthalmic solution    diazepam (VALIUM) 5 MG tablet Take 1 tablet (5 mg total) by mouth 2 (two) times daily. (Patient not taking: Reported on 03/14/2019)   doxycycline (VIBRA-TABS) 100 MG tablet Take 1 tablet (100 mg total) by mouth 2 (two) times daily. (Patient not taking: Reported  on 03/14/2019)   hydrochlorothiazide (HYDRODIURIL) 25 MG tablet Take 25 mg by mouth daily.   LUMIGAN 0.01 % SOLN INSTILL 1 DROP INTO EACH EYE EVERY EVENING   predniSONE (DELTASONE) 20 MG tablet 3 tabs po day one, then 2 tabs daily x 4 days (Patient not taking: Reported on 03/14/2019)   Testosterone 20.25 MG/ACT (1.62%) GEL    [DISCONTINUED] amLODipine (NORVASC) 10 MG tablet Take 1 tablet by mouth once daily   No facility-administered encounter medications on file as of 03/14/2019.     Activities of Daily Living In your present state of health, do you have any difficulty performing the following activities: 03/14/2019  Hearing? N  Vision? N  Difficulty concentrating or making decisions? N  Walking or climbing stairs? N  Dressing or bathing? N  Doing errands, shopping? N  Preparing Food and eating ? N  Using the Toilet? N  In the past six months, have you accidently leaked urine? N  Do you have problems with loss of bowel control? N  Managing your Medications? N  Managing your Finances? N  Housekeeping or managing your Housekeeping? N  Some recent data might be hidden    Patient Care Team: Arnette Felts, FNP as PCP - General (General Practice) Chalmers Guest, MD as Consulting Physician (Ophthalmology)   Assessment:   This is a routine wellness examination for Wesley Webb.  Exercise Activities and Dietary recommendations Current Exercise Habits: The patient does not participate in regular exercise at present  Goals     Exercise 150 min/wk Moderate Activity (pt-stated)     Wants to walk more       Fall Risk Fall Risk  03/14/2019  Falls in the past year? 0  Risk for fall due to : Medication side effect  Follow up Falls prevention discussed;Education provided   Is the patient's home free of loose throw rugs in walkways, pet beds, electrical cords, etc?   yes      Grab bars in the bathroom? no      Handrails on the stairs?   yes      Adequate lighting?   yes  Timed Get Up  and Go Performed: n/a  Depression Screen PHQ 2/9 Scores 03/14/2019  PHQ - 2 Score 0  PHQ- 9 Score 0    Cognitive Function     6CIT Screen 03/14/2019  What Year? 0 points  What month? 0 points  What time? 3 points  Count back from 20 0 points  Months in reverse 4 points  Repeat phrase 2 points  Total Score 9     There is no  immunization history on file for this patient.  Qualifies for Shingles Vaccine? yes  Screening Tests Health Maintenance  Topic Date Due   INFLUENZA VACCINE  03/26/2019 (Originally 07/26/2018)   COLONOSCOPY  03/13/2020 (Originally 02/26/1997)   TETANUS/TDAP  03/13/2020 (Originally 02/26/1966)   Hepatitis C Screening  03/13/2020 (Originally Aug 14, 1946)   PNA vac Low Risk Adult (1 of 2 - PCV13) 03/13/2020 (Originally 02/27/2012)   Cancer Screenings: Lung: Low Dose CT Chest recommended if Age 84-80 years, 30 pack-year currently smoking OR have quit w/in 15years. Patient does not qualify. Colorectal: declines  Additional Screenings:  Hepatitis C Screening: declines      Plan:    Wants to start walking more  I have personally reviewed and noted the following in the patients chart:    Medical and social history  Use of alcohol, tobacco or illicit drugs   Current medications and supplements  Functional ability and status  Nutritional status  Physical activity  Advanced directives  List of other physicians  Hospitalizations, surgeries, and ER visits in previous 12 months  Vitals  Screenings to include cognitive, depression, and falls  Referrals and appointments  In addition, I have reviewed and discussed with patient certain preventive protocols, quality metrics, and best practice recommendations. A written personalized care plan for preventive services as well as general preventive health recommendations were provided to patient.     Barb Merino, LPN  [id number]

## 2019-03-14 NOTE — Addendum Note (Signed)
Addended by: Barb Merino on: 03/14/2019 02:12 PM   Modules accepted: Orders

## 2019-03-14 NOTE — Patient Instructions (Signed)
Calorie Counting for Weight Loss Calories are units of energy. Your body needs a certain amount of calories from food to keep you going throughout the day. When you eat more calories than your body needs, your body stores the extra calories as fat. When you eat fewer calories than your body needs, your body burns fat to get the energy it needs. Calorie counting means keeping track of how many calories you eat and drink each day. Calorie counting can be helpful if you need to lose weight. If you make sure to eat fewer calories than your body needs, you should lose weight. Ask your health care provider what a healthy weight is for you. For calorie counting to work, you will need to eat the right number of calories in a day in order to lose a healthy amount of weight per week. A dietitian can help you determine how many calories you need in a day and will give you suggestions on how to reach your calorie goal.  A healthy amount of weight to lose per week is usually 1-2 lb (0.5-0.9 kg). This usually means that your daily calorie intake should be reduced by 500-750 calories.  Eating 1,200 - 1,500 calories per day can help most women lose weight.  Eating 1,500 - 1,800 calories per day can help most men lose weight. What is my plan? My goal is to have ___1800_______ calories per day. If I have this many calories per day, I should lose around ___1-2__ pounds per week. What do I need to know about calorie counting? In order to meet your daily calorie goal, you will need to:  Find out how many calories are in each food you would like to eat. Try to do this before you eat.  Decide how much of the food you plan to eat.  Write down what you ate and how many calories it had. Doing this is called keeping a food log. To successfully lose weight, it is important to balance calorie counting with a healthy lifestyle that includes regular activity. Aim for 150 minutes of moderate exercise (such as walking) or 75  minutes of vigorous exercise (such as running) each week. Where do I find calorie information?  The number of calories in a food can be found on a Nutrition Facts label. If a food does not have a Nutrition Facts label, try to look up the calories online or ask your dietitian for help. Remember that calories are listed per serving. If you choose to have more than one serving of a food, you will have to multiply the calories per serving by the amount of servings you plan to eat. For example, the label on a package of bread might say that a serving size is 1 slice and that there are 90 calories in a serving. If you eat 1 slice, you will have eaten 90 calories. If you eat 2 slices, you will have eaten 180 calories. How do I keep a food log? Immediately after each meal, record the following information in your food log:  What you ate. Don't forget to include toppings, sauces, and other extras on the food.  How much you ate. This can be measured in cups, ounces, or number of items.  How many calories each food and drink had.  The total number of calories in the meal. Keep your food log near you, such as in a small notebook in your pocket, or use a mobile app or website. Some programs will calculate  calories for you and show you how many calories you have left for the day to meet your goal. °What are some calorie counting tips? ° °· Use your calories on foods and drinks that will fill you up and not leave you hungry: °? Some examples of foods that fill you up are nuts and nut butters, vegetables, lean proteins, and high-fiber foods like whole grains. High-fiber foods are foods with more than 5 g fiber per serving. °? Drinks such as sodas, specialty coffee drinks, alcohol, and juices have a lot of calories, yet do not fill you up. °· Eat nutritious foods and avoid empty calories. Empty calories are calories you get from foods or beverages that do not have many vitamins or protein, such as candy, sweets, and  soda. It is better to have a nutritious high-calorie food (such as an avocado) than a food with few nutrients (such as a bag of chips). °· Know how many calories are in the foods you eat most often. This will help you calculate calorie counts faster. °· Pay attention to calories in drinks. Low-calorie drinks include water and unsweetened drinks. °· Pay attention to nutrition labels for "low fat" or "fat free" foods. These foods sometimes have the same amount of calories or more calories than the full fat versions. They also often have added sugar, starch, or salt, to make up for flavor that was removed with the fat. °· Find a way of tracking calories that works for you. Get creative. Try different apps or programs if writing down calories does not work for you. °What are some portion control tips? °· Know how many calories are in a serving. This will help you know how many servings of a certain food you can have. °· Use a measuring cup to measure serving sizes. You could also try weighing out portions on a kitchen scale. With time, you will be able to estimate serving sizes for some foods. °· Take some time to put servings of different foods on your favorite plates, bowls, and cups so you know what a serving looks like. °· Try not to eat straight from a bag or box. Doing this can lead to overeating. Put the amount you would like to eat in a cup or on a plate to make sure you are eating the right portion. °· Use smaller plates, glasses, and bowls to prevent overeating. °· Try not to multitask (for example, watch TV or use your computer) while eating. If it is time to eat, sit down at a table and enjoy your food. This will help you to know when you are full. It will also help you to be aware of what you are eating and how much you are eating. °What are tips for following this plan? °Reading food labels °· Check the calorie count compared to the serving size. The serving size may be smaller than what you are used to  eating. °· Check the source of the calories. Make sure the food you are eating is high in vitamins and protein and low in saturated and trans fats. °Shopping °· Read nutrition labels while you shop. This will help you make healthy decisions before you decide to purchase your food. °· Make a grocery list and stick to it. °Cooking °· Try to cook your favorite foods in a healthier way. For example, try baking instead of frying. °· Use low-fat dairy products. °Meal planning °· Use more fruits and vegetables. Half of your plate should be fruits   and vegetables.  Include lean proteins like poultry and fish. How do I count calories when eating out?  Ask for smaller portion sizes.  Consider sharing an entree and sides instead of getting your own entree.  If you get your own entree, eat only half. Ask for a box at the beginning of your meal and put the rest of your entree in it so you are not tempted to eat it.  If calories are listed on the menu, choose the lower calorie options.  Choose dishes that include vegetables, fruits, whole grains, low-fat dairy products, and lean protein.  Choose items that are boiled, broiled, grilled, or steamed. Stay away from items that are buttered, battered, fried, or served with cream sauce. Items labeled "crispy" are usually fried, unless stated otherwise.  Choose water, low-fat milk, unsweetened iced tea, or other drinks without added sugar. If you want an alcoholic beverage, choose a lower calorie option such as a glass of wine or light beer.  Ask for dressings, sauces, and syrups on the side. These are usually high in calories, so you should limit the amount you eat.  If you want a salad, choose a garden salad and ask for grilled meats. Avoid extra toppings like bacon, cheese, or fried items. Ask for the dressing on the side, or ask for olive oil and vinegar or lemon to use as dressing.  Estimate how many servings of a food you are given. For example, a serving of  cooked rice is  cup or about the size of half a baseball. Knowing serving sizes will help you be aware of how much food you are eating at restaurants. The list below tells you how big or small some common portion sizes are based on everyday objects: ? 1 oz--4 stacked dice. ? 3 oz--1 deck of cards. ? 1 tsp--1 die. ? 1 Tbsp-- a ping-pong ball. ? 2 Tbsp--1 ping-pong ball. ?  cup-- baseball. ? 1 cup--1 baseball. Summary  Calorie counting means keeping track of how many calories you eat and drink each day. If you eat fewer calories than your body needs, you should lose weight.  A healthy amount of weight to lose per week is usually 1-2 lb (0.5-0.9 kg). This usually means reducing your daily calorie intake by 500-750 calories.  The number of calories in a food can be found on a Nutrition Facts label. If a food does not have a Nutrition Facts label, try to look up the calories online or ask your dietitian for help.  Use your calories on foods and drinks that will fill you up, and not on foods and drinks that will leave you hungry.  Use smaller plates, glasses, and bowls to prevent overeating. This information is not intended to replace advice given to you by your health care provider. Make sure you discuss any questions you have with your health care provider. Document Released: 12/12/2005 Document Revised: 08/31/2018 Document Reviewed: 11/11/2016 Elsevier Interactive Patient Education  2019 [employer].

## 2019-03-15 ENCOUNTER — Other Ambulatory Visit: Payer: Self-pay | Admitting: Internal Medicine

## 2019-03-15 LAB — LIPID PANEL
CHOLESTEROL TOTAL: 180 mg/dL (ref 100–199)
Chol/HDL Ratio: 4.7 ratio (ref 0.0–5.0)
HDL: 38 mg/dL — ABNORMAL LOW (ref >39)
LDL Calculated: 114 mg/dL — ABNORMAL HIGH (ref 0–99)
TRIGLYCERIDES: 138 mg/dL (ref 0–149)
VLDL Cholesterol Cal: 28 mg/dL (ref 5–40)

## 2019-03-15 LAB — HEMOGLOBIN A1C
Est. average glucose Bld gHb Est-mCnc: 148 mg/dL
Hgb A1c MFr Bld: 6.8 % — ABNORMAL HIGH (ref 4.8–5.6)

## 2019-03-15 LAB — CMP14 + ANION GAP
A/G RATIO: 1.1 — AB (ref 1.2–2.2)
ALT: 20 IU/L (ref 0–44)
AST: 17 IU/L (ref 0–40)
Albumin: 4 g/dL (ref 3.7–4.7)
Alkaline Phosphatase: 55 IU/L (ref 39–117)
Anion Gap: 13 mmol/L (ref 10.0–18.0)
BILIRUBIN TOTAL: 0.5 mg/dL (ref 0.0–1.2)
BUN/Creatinine Ratio: 11 (ref 10–24)
BUN: 13 mg/dL (ref 8–27)
CALCIUM: 9.5 mg/dL (ref 8.6–10.2)
CHLORIDE: 98 mmol/L (ref 96–106)
CO2: 25 mmol/L (ref 20–29)
Creatinine, Ser: 1.15 mg/dL (ref 0.76–1.27)
GFR calc Af Amer: 73 mL/min/{1.73_m2} (ref >59)
GFR, EST NON AFRICAN AMERICAN: 63 mL/min/{1.73_m2} (ref >59)
GLOBULIN, TOTAL: 3.7 g/dL (ref 1.5–4.5)
GLUCOSE: 251 mg/dL — AB (ref 65–99)
POTASSIUM: 5.7 mmol/L — AB (ref 3.5–5.2)
SODIUM: 136 mmol/L (ref 134–144)
Total Protein: 7.7 g/dL (ref 6.0–8.5)

## 2019-03-15 LAB — CBC
Hematocrit: 39.3 % (ref 37.5–51.0)
Hemoglobin: 12.9 g/dL — ABNORMAL LOW (ref 13.0–17.7)
MCH: 30.4 pg (ref 26.6–33.0)
MCHC: 32.8 g/dL (ref 31.5–35.7)
MCV: 93 fL (ref 79–97)
PLATELETS: 234 10*3/uL (ref 150–450)
RBC: 4.24 x10E6/uL (ref 4.14–5.80)
RDW: 12.1 % (ref 11.6–15.4)
WBC: 4.8 10*3/uL (ref 3.4–10.8)

## 2019-03-15 NOTE — Progress Notes (Unsigned)
Will you please abstract her immunizations, colonoscopy, mammogram, bone density, and her last cholesterol panel, creatinine, and HGBA1C from [facility]? I need this to finish my note and call her about her lab results.

## 2019-04-03 ENCOUNTER — Other Ambulatory Visit: Payer: Self-pay | Admitting: Nurse Practitioner

## 2019-05-07 ENCOUNTER — Other Ambulatory Visit: Payer: Self-pay | Admitting: Nurse Practitioner

## 2019-05-10 ENCOUNTER — Other Ambulatory Visit: Payer: Self-pay | Admitting: Nurse Practitioner

## 2019-05-21 ENCOUNTER — Other Ambulatory Visit: Payer: Self-pay | Admitting: Nurse Practitioner

## 2019-05-30 ENCOUNTER — Other Ambulatory Visit: Payer: Self-pay

## 2019-05-30 ENCOUNTER — Encounter: Payer: Self-pay | Admitting: Nurse Practitioner

## 2019-05-30 ENCOUNTER — Ambulatory Visit (INDEPENDENT_AMBULATORY_CARE_PROVIDER_SITE_OTHER): Payer: Medicare Other | Admitting: Nurse Practitioner

## 2019-05-30 VITALS — BP 128/80 | HR 90 | Temp 98.8°F | Ht 68.0 in | Wt 239.6 lb

## 2019-05-30 DIAGNOSIS — R3915 Urgency of urination: Secondary | ICD-10-CM

## 2019-05-30 DIAGNOSIS — R81 Glycosuria: Secondary | ICD-10-CM | POA: Diagnosis not present

## 2019-05-30 DIAGNOSIS — R109 Unspecified abdominal pain: Secondary | ICD-10-CM

## 2019-05-30 LAB — POCT URINALYSIS DIPSTICK
Bilirubin, UA: NEGATIVE
Glucose, UA: POSITIVE — AB
Ketones, UA: NEGATIVE
Leukocytes, UA: NEGATIVE
Nitrite, UA: NEGATIVE
Protein, UA: NEGATIVE
Spec Grav, UA: 1.025 (ref 1.010–1.025)
Urobilinogen, UA: 1 E.U./dL
pH, UA: 5.5 (ref 5.0–8.0)

## 2019-05-30 MED ORDER — IBUPROFEN 800 MG PO TABS
800.0000 mg | ORAL_TABLET | Freq: Three times a day (TID) | ORAL | 0 refills | Status: DC | PRN
Start: 2019-05-30 — End: 2019-09-02

## 2019-05-30 MED ORDER — NITROFURANTOIN MONOHYD MACRO 100 MG PO CAPS
100.0000 mg | ORAL_CAPSULE | Freq: Two times a day (BID) | ORAL | 0 refills | Status: DC
Start: 2019-05-30 — End: 2019-06-01

## 2019-05-30 NOTE — Progress Notes (Signed)
Subjective:     Patient ID: Wesley Webb , male    DOB: 21-Aug-1946 , 73 y.o.   MRN: [id number]   Chief Complaint  Patient presents with  . Back Pain    patient states he thinks he may have a kidney infection     HPI  Drinks mostly coffee and ginger ale and some coffee  Back Pain  This is a new problem. The problem occurs constantly. The pain is present in the lumbar spine. The quality of the pain is described as aching. The pain is at a severity of 8/10. Pertinent negatives include no abdominal pain, chest pain, dysuria, fever, headaches or weakness. Risk factors include obesity. He has tried nothing for the symptoms.     Past Medical History:  Diagnosis Date  . Brain aneurysm   . Hypertension      Family History  Problem Relation Age of Onset  . Glaucoma Mother      Current Outpatient Medications:  .  amLODipine (NORVASC) 10 MG tablet, Take 1 tablet by mouth once daily, Disp: 30 tablet, Rfl: 0 .  COMBIGAN 0.2-0.5 % ophthalmic solution, , Disp: , Rfl:  .  hydrochlorothiazide (HYDRODIURIL) 25 MG tablet, Take 25 mg by mouth daily., Disp: , Rfl:  .  LUMIGAN 0.01 % SOLN, INSTILL 1 DROP INTO EACH EYE EVERY EVENING, Disp: , Rfl:  .  quinapril (ACCUPRIL) 20 MG tablet, TAKE 1 & 1/2 (ONE & ONE-HALF) TABLETS BY MOUTH ONCE DAILY (Patient taking differently: Patient takes 2 tabs sometimes), Disp: 135 tablet, Rfl: 0   No Known Allergies   Review of Systems  Constitutional: Negative for fatigue and fever.  Cardiovascular: Negative.  Negative for chest pain, palpitations and leg swelling.  Gastrointestinal: Negative for abdominal pain.  Genitourinary: Negative for difficulty urinating and dysuria.       Urinary urgency  Musculoskeletal: Positive for back pain.  Neurological: Negative for dizziness, weakness and headaches.     Today's Vitals   05/30/19 1426  BP: 128/80  Pulse: 90  Temp: 98.8 F (37.1 C)  TempSrc: Oral  Weight: 239 lb 9.6 oz (108.7 kg)  Height: 5\' 8"   (1.727 m)  PainSc: 8   PainLoc: Back   Body mass index is 36.43 kg/m.   Objective:  Physical Exam Vitals signs reviewed.  Constitutional:      Appearance: Normal appearance.  Cardiovascular:     Rate and Rhythm: Normal rate and regular rhythm.     Pulses: Normal pulses.     Heart sounds: Normal heart sounds. No murmur.  Genitourinary:    Comments: Smells of urine Skin:    General: Skin is warm and dry.  Neurological:     General: No focal deficit present.     Mental Status: He is alert and oriented to person, place, and time.         Assessment And Plan:     1. Urinary urgency  He has glucose in his urine  Due to the symptoms will treat with nitrofurantoin  - nitrofurantoin, macrocrystal-monohydrate, (MACROBID) 100 MG capsule; Take 1 capsule (100 mg total) by mouth 2 (two) times daily.  Dispense: 10 capsule; Refill: 0 - PSA  2. Flank pain  Negative for CVA tenderness  Likely inflammation, take ibuprofen as needed   If symptoms worsen return call to office. - ibuprofen (ADVIL) 800 MG tablet; Take 1 tablet (800 mg total) by mouth every 8 (eight) hours as needed.  Dispense: 30 tablet; Refill: 0  Aylen Stradford, FNP    THE PATIENT IS ENCOURAGED TO PRACTICE SOCIAL DISTANCING DUE TO THE COVID-19 PANDEMIC.   

## 2019-05-31 LAB — PSA: Prostate Specific Ag, Serum: 0.3 ng/mL (ref 0.0–4.0)

## 2019-06-01 ENCOUNTER — Encounter (HOSPITAL_COMMUNITY): Payer: Self-pay | Admitting: Emergency Medicine

## 2019-06-01 ENCOUNTER — Ambulatory Visit (HOSPITAL_COMMUNITY)
Admission: EM | Admit: 2019-06-01 | Discharge: 2019-06-01 | Disposition: A | Discharge status: Home / Self Care | Payer: Medicare Other | Attending: Family Medicine | Admitting: Family Medicine

## 2019-06-01 ENCOUNTER — Other Ambulatory Visit: Payer: Self-pay

## 2019-06-01 DIAGNOSIS — R350 Frequency of micturition: Secondary | ICD-10-CM | POA: Diagnosis not present

## 2019-06-01 LAB — POCT URINALYSIS DIP (DEVICE)
Bilirubin Urine: NEGATIVE
Glucose, UA: NEGATIVE mg/dL
Hgb urine dipstick: NEGATIVE
Ketones, ur: NEGATIVE mg/dL
Leukocytes,Ua: NEGATIVE
Nitrite: NEGATIVE
Protein, ur: NEGATIVE mg/dL
Specific Gravity, Urine: 1.02 (ref 1.005–1.030)
Urobilinogen, UA: 0.2 mg/dL (ref 0.0–1.0)
pH: 6 (ref 5.0–8.0)

## 2019-06-01 MED ORDER — CEPHALEXIN 500 MG PO CAPS: 500.0000 mg | ORAL_CAPSULE | Freq: Three times a day (TID) | ORAL | 0 refills | Status: AC

## 2019-06-01 NOTE — ED Triage Notes (Signed)
Per pt he was seen at dr for back pain a few days ago and was tested for kidney infection. And was given nitrofurantoin Mono and said that the medication made his feet swell. Still having back pain. No urinary sym,ptonms. No burning or hurting eith urination

## 2019-06-01 NOTE — Discharge Instructions (Signed)
Discontinue Macrobid as this caused lower extremity edema. We will write for 3 more days of Keflex, that you can fill if symptoms return before Sunday evening. If symptoms develop later than 3 days from now, you should be reevaluated.

## 2019-06-01 NOTE — ED Provider Notes (Signed)
[place]    CSN: [id number] Arrival date & time: 06/01/19  1538     History   Chief Complaint Chief Complaint  Patient presents with  . Back Pain    HPI Wesley Webb is a 73 y.o. male with history of hypertension presenting for possible medication side effect.  Patient states that he was treated by his primary care physician on Thursday for suspected "kidney infection".  Patient was given Macrobid: Took 1 dose last night, 1 dose this morning.  Patient noticed bilateral lower extremity edema.  Denies fever, angioedema, swelling, rash, chest pain, shortness of breath.  Patient states that he was having urinary frequency, back pain, lower abdominal pain.  Since taking both doses of Macrobid he states his symptoms are largely resolved.  Has noticed that he is decreased frequency of his urine, output is at his baseline.  Patient denies hematuria, penile pain or discharge.    Past Medical History:  Diagnosis Date  . Brain aneurysm   . Hypertension     Patient Active Problem List   Diagnosis Date Noted  . Urinary urgency 05/30/2019  . Mixed hyperlipidemia 09/17/2018  . Essential hypertension 09/17/2018    Past Surgical History:  Procedure Laterality Date  . SHOULDER SURGERY    . 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 Authorizing Provider  amLODipine (NORVASC) 10 MG tablet Take 1 tablet by mouth once daily 05/21/19   Minette Brine, FNP  cephALEXin (KEFLEX) 500 MG capsule Take 1 capsule (500 mg total) by mouth 3 (three) times daily for 3 days. 06/01/19 06/04/19  Hall-Potvin, [place], PA-C  COMBIGAN 0.2-0.5 % ophthalmic solution  02/21/19   [provider]  ibuprofen (ADVIL) 800 MG tablet Take 1 tablet (800 mg total) by mouth every 8 (eight) hours as needed. 05/30/19   Minette Brine, FNP  LUMIGAN 0.01 % SOLN INSTILL 1 DROP INTO [facility] EYE EVERY EVENING 03/06/19   [provider]  quinapril (ACCUPRIL) 20 MG tablet TAKE 1 & 1/2 (ONE & ONE-HALF) TABLETS BY MOUTH ONCE DAILY Patient taking differently: Patient takes 2 tabs sometimes 05/07/19   Minette Brine, FNP    Family History Family History  Problem Relation Age of Onset  . Glaucoma Mother     Social History Social History   Tobacco Use  . Smoking status: Never Smoker  . Smokeless tobacco: Never Used  Substance Use Topics  . Alcohol use: No  . Drug use: No     Allergies   Patient has no known allergies.   Review of Systems As per HPI   Physical Exam Triage Vital Signs ED Triage Vitals  Enc Vitals Group     BP 06/01/19 1608 132/81     Pulse Rate 06/01/19 1608 63     Resp 06/01/19 1608 16     Temp 06/01/19 1608 98.7 F (37.1 C)     Temp Source 06/01/19 1608 Oral     SpO2 06/01/19 1608 97 %     Weight --      Height --      Head Circumference --      Peak Flow --      Pain Score 06/01/19 1609 5     Pain Loc --      Pain Edu? --      Excl. in GC? --    No  data found.  Updated Vital Signs BP 132/81 (BP Location: Right Arm)   Pulse 63   Temp 98.7 F (37.1 C) (Oral)   Resp 16   SpO2 97%   Visual Acuity Right Eye Distance:   Left Eye Distance:   Bilateral Distance:    Right Eye Near:   Left Eye Near:    Bilateral Near:     Physical Exam Constitutional:      General: He is not in acute distress. HENT:     Head: Normocephalic and atraumatic.  Eyes:     General: No scleral icterus.    Pupils: Pupils are equal, round, and reactive to light.  Cardiovascular:     Rate and Rhythm: Normal rate.  Pulmonary:     Effort: Pulmonary effort is normal.  Abdominal:     General: Bowel sounds are normal. There is no distension.     Palpations: Abdomen is soft.     Tenderness: There is no abdominal tenderness. There is no right CVA tenderness, left CVA tenderness or guarding.  Musculoskeletal:     Comments: 1+ bilateral lower extremity edema.  Patient wearing  compression stockings.  Legs are nontender.  Skin:    Coloration: Skin is not jaundiced or pale.  Neurological:     Mental Status: He is alert and oriented to person, place, and time.      UC Treatments / Results  Labs (all labs ordered are listed, but only abnormal results are displayed) Labs Reviewed  POCT URINALYSIS DIP (DEVICE)    EKG None  Radiology No results found.  Procedures Procedures (including critical care time)  Medications Ordered in UC Medications - No data to display  Initial Impression / Assessment and Plan / UC Course  I have reviewed the triage vital signs and the nursing notes.  Pertinent labs & imaging results that were available during my care of the patient were reviewed by me and considered in my medical decision making (see chart for details).     73 year old male with history of hypertension presenting for concern of lower extremity edema status post Macrobid.  Patient states urinary symptoms have resolved, POCT urinalysis unremarkable.  Shared decision making with patient and son resulting in 3-day course of Keflex should patient have occurrence of urinary symptoms over the weekend.  If delayed recurrence, patient elevated.  Patient and son verbalized understanding. Final Clinical Impressions(s) / UC Diagnoses   Final diagnoses:  Urinary frequency     Discharge Instructions     Discontinue Macrobid as this caused lower extremity edema. We will write for 3 more days of Keflex, that you can fill if symptoms return before Sunday evening. If symptoms develop later than 3 days from now, you should be reevaluated.    ED Prescriptions    Medication Sig Dispense Auth. Provider   cephALEXin (KEFLEX) 500 MG capsule Take 1 capsule (500 mg total) by mouth 3 (three) times daily for 3 days. 9 capsule Hall-Potvin, [place], PA-C     Controlled Substance Prescriptions [place] Controlled Substance Registry consulted? Not Applicable   Shea EvansHall-Potvin,  [place], [place]-C 06/01/19 1658

## 2019-06-03 ENCOUNTER — Encounter: Payer: Self-pay | Admitting: Nurse Practitioner

## 2019-06-03 ENCOUNTER — Other Ambulatory Visit: Payer: Medicare Other

## 2019-06-03 ENCOUNTER — Telehealth: Payer: Self-pay

## 2019-06-03 ENCOUNTER — Other Ambulatory Visit: Payer: Self-pay

## 2019-06-03 DIAGNOSIS — R81 Glycosuria: Secondary | ICD-10-CM

## 2019-06-03 NOTE — Telephone Encounter (Signed)
Left voicemail. Returned call to pt left voicemail stating he was "having problems"

## 2019-06-04 LAB — HEMOGLOBIN A1C
Est. average glucose Bld gHb Est-mCnc: 128 mg/dL
Hgb A1c MFr Bld: 6.1 % — ABNORMAL HIGH (ref 4.8–5.6)

## 2019-06-07 ENCOUNTER — Emergency Department (HOSPITAL_COMMUNITY)
Admission: EM | Admit: 2019-06-07 | Discharge: 2019-06-07 | Disposition: A | Discharge status: Home / Self Care | Payer: Medicare Other | Attending: Emergency Medicine | Admitting: Emergency Medicine

## 2019-06-07 ENCOUNTER — Other Ambulatory Visit: Payer: Self-pay

## 2019-06-07 ENCOUNTER — Encounter (HOSPITAL_COMMUNITY): Payer: Self-pay | Admitting: Emergency Medicine

## 2019-06-07 DIAGNOSIS — I10 Essential (primary) hypertension: Secondary | ICD-10-CM | POA: Diagnosis not present

## 2019-06-07 DIAGNOSIS — R609 Edema, unspecified: Secondary | ICD-10-CM | POA: Diagnosis present

## 2019-06-07 DIAGNOSIS — Z79899 Other long term (current) drug therapy: Secondary | ICD-10-CM | POA: Diagnosis not present

## 2019-06-07 LAB — COMPREHENSIVE METABOLIC PANEL
ALT: 22 U/L (ref 0–44)
AST: 20 U/L (ref 15–41)
Albumin: 3.6 g/dL (ref 3.5–5.0)
Alkaline Phosphatase: 35 U/L — ABNORMAL LOW (ref 38–126)
Anion gap: 8 (ref 5–15)
BUN: 10 mg/dL (ref 8–23)
CO2: 26 mmol/L (ref 22–32)
Calcium: 9.4 mg/dL (ref 8.9–10.3)
Chloride: 104 mmol/L (ref 98–111)
Creatinine, Ser: 1.19 mg/dL (ref 0.61–1.24)
GFR calc Af Amer: >60 mL/min (ref >60)
GFR calc non Af Amer: >60 mL/min (ref >60)
Glucose, Bld: 146 mg/dL — ABNORMAL HIGH (ref 70–99)
Potassium: 3.8 mmol/L (ref 3.5–5.1)
Sodium: 138 mmol/L (ref 135–145)
Total Bilirubin: 0.7 mg/dL (ref 0.3–1.2)
Total Protein: 7.7 g/dL (ref 6.5–8.1)

## 2019-06-07 LAB — CBC
HCT: 37 % — ABNORMAL LOW (ref 39.0–52.0)
Hemoglobin: 12 g/dL — ABNORMAL LOW (ref 13.0–17.0)
MCH: 30.8 pg (ref 26.0–34.0)
MCHC: 32.4 g/dL (ref 30.0–36.0)
MCV: 95.1 fL (ref 80.0–100.0)
Platelets: 205 10*3/uL (ref 150–400)
RBC: 3.89 MIL/uL — ABNORMAL LOW (ref 4.22–5.81)
RDW: 13.3 % (ref 11.5–15.5)
WBC: 3.9 10*3/uL — ABNORMAL LOW (ref 4.0–10.5)
nRBC: 0 % (ref 0.0–0.2)

## 2019-06-07 LAB — BRAIN NATRIURETIC PEPTIDE: B Natriuretic Peptide: 20.6 pg/mL (ref 0.0–100.0)

## 2019-06-07 MED ORDER — FUROSEMIDE 20 MG PO TABS
40.0000 mg | ORAL_TABLET | Freq: Once | ORAL | Status: AC
  Administered 2019-06-07: 40 mg via ORAL
  Filled 2019-06-07: qty 2

## 2019-06-07 MED ORDER — HYDROCHLOROTHIAZIDE 25 MG PO TABS: 25.0000 mg | ORAL_TABLET | Freq: Every day | ORAL | 0 refills | Status: DC

## 2019-06-07 NOTE — ED Provider Notes (Signed)
[facility] EMERGENCY DEPARTMENT Provider Note   CSN: [id number] Arrival date & time: 06/07/19  0626    History   Chief Complaint Chief Complaint  Patient presents with  . Leg Swelling    HPI Wesley Webb is a 73 y.o. male.     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  The patient reports and this is verified by my review of the medical record that he had been at his doctor's office on June 4 during which time because of some back pain he was given Macrobid for a possible kidney infection.  The urinalysis at that time showed glucosuria but no signs of infection.  He reports that 2 days later he went to the urgent care which was also verified in the medical record, his urinalysis was repeated and due to the thought that his Macrobid was causing swelling of his legs which was the reason for his urgent care visit he was switched to cephalexin, at that time he had a totally normal urinalysis as well.  He reports that over the last week he has had persistent swelling of the legs, it is not getting worse or better, he does not have shortness of breath or coughing, he does not get orthopneic or dyspneic on exertion.  He reports that he does have back pain but it is only when he tries to move around and not when he is laying or sitting.  He is feeling very comfortable at this time.  There is no fevers or chills, no nausea vomiting or diarrhea, no blood in the stools.  Past Medical History:  Diagnosis Date  . Brain aneurysm   . Hypertension     Patient Active Problem List   Diagnosis Date Noted  . Urinary urgency 05/30/2019  . Mixed hyperlipidemia 09/17/2018  . Essential hypertension 09/17/2018    Past Surgical History:  Procedure Laterality Date  . SHOULDER SURGERY    . shunt placed in  head          Home Medications    Prior to Admission medications   Medication Sig Start Date End Date Taking? Authorizing Provider  COMBIGAN 0.2-0.5 % ophthalmic solution  02/21/19   [provider]  hydrochlorothiazide (HYDRODIURIL) 25 MG tablet Take 1 tablet (25 mg total) by mouth daily. 06/07/19   Noemi Chapel, MD  ibuprofen (ADVIL) 800 MG tablet Take 1 tablet (800 mg total) by mouth every 8 (eight) hours as needed. 05/30/19   Minette Brine, FNP  LUMIGAN 0.01 % SOLN INSTILL 1 DROP INTO [facility] EYE EVERY EVENING 03/06/19   [provider]  quinapril (ACCUPRIL) 20 MG tablet TAKE 1 & 1/2 (ONE & ONE-HALF) TABLETS BY MOUTH ONCE DAILY Patient taking differently: Patient takes 2 tabs sometimes 05/07/19   Minette Brine, FNP  amLODipine (NORVASC) 10 MG tablet Take 1 tablet by mouth once daily 05/21/19 06/07/19  Minette Brine, FNP    Family History Family History  Problem Relation Age of Onset  . Glaucoma Mother     Social History Social History   Tobacco Use  . Smoking status: Never Smoker  . Smokeless tobacco: Never Used  Substance Use Topics  . Alcohol use: No  . Drug use: No     Allergies   Patient has no known allergies.  Review of Systems Review of Systems  All other systems reviewed and are negative.    Physical Exam Updated Vital Signs BP 134/74   Pulse (!) 55   Temp 98.1 F (36.7 C) (Oral)   Resp 17   Ht 1.753 m (5\' 9" )   Wt 108 kg   SpO2 (!) 89%   BMI 35.16 kg/m   Physical Exam Vitals signs and nursing note reviewed.  Constitutional:      General: He is not in acute distress.    Appearance: He is well-developed.  HENT:     Head: Normocephalic and atraumatic.     Comments: Shunt palpated on the left side of the scalp, appears normal    Mouth/Throat:     Pharynx: No oropharyngeal exudate.  Eyes:     General: No scleral icterus.       Right eye: No discharge.        Left eye: No discharge.     Conjunctiva/sclera: Conjunctivae normal.      Pupils: Pupils are equal, round, and reactive to light.  Neck:     Musculoskeletal: Normal range of motion and neck supple.     Thyroid: No thyromegaly.     Vascular: No JVD.  Cardiovascular:     Rate and Rhythm: Normal rate and regular rhythm.     Heart sounds: Normal heart sounds. No murmur. No friction rub. No gallop.   Pulmonary:     Effort: Pulmonary effort is normal. No respiratory distress.     Breath sounds: Normal breath sounds. No wheezing or rales.  Abdominal:     General: Bowel sounds are normal. There is no distension.     Palpations: Abdomen is soft. There is no mass.     Tenderness: There is no abdominal tenderness.  Musculoskeletal: Normal range of motion.        General: Swelling present. No tenderness, deformity or signs of injury.     Right lower leg: Edema present.     Left lower leg: Edema present.  Lymphadenopathy:     Cervical: No cervical adenopathy.  Skin:    General: Skin is warm and dry.     Findings: No erythema or rash.  Neurological:     Mental Status: He is alert.     Coordination: Coordination normal.     Comments: The patient is awake alert and able to follow all of my commands with normal memory understanding and coordination  Psychiatric:        Behavior: Behavior normal.      ED Treatments / Results  Labs (all labs ordered are listed, but only abnormal results are displayed) Labs Reviewed  COMPREHENSIVE METABOLIC PANEL - Abnormal; Notable for the following components:      Result Value   Glucose, Bld 146 (*)    Alkaline Phosphatase 35 (*)    All other components within normal limits  CBC - Abnormal; Notable for the following components:   WBC 3.9 (*)    RBC 3.89 (*)    Hemoglobin 12.0 (*)    HCT 37.0 (*)    All other components within normal limits  BRAIN NATRIURETIC PEPTIDE    EKG None  Radiology No results found.  Procedures Procedures (including critical care time)  Medications Ordered in ED Medications   furosemide (LASIX) tablet 40 mg (40 mg Oral Given 06/07/19 0807)     Initial Impression / Assessment and Plan / ED Course  I have reviewed the triage vital signs and the  nursing notes.  Pertinent labs & imaging results that were available during my care of the patient were reviewed by me and considered in my medical decision making (see chart for details).  Clinical Course as of Jun 06 928  Fri Jun 07, 2019  0853 Blood counts unremarkable without any significant anemia, white blood cell count minimally depressed at 3.9, metabolic panel so far totally normal without liver dysfunction or renal dysfunction.  BNP pending.   [BM]  0927 BNP is normal, the patient is stable for discharge, he will need to have his blood pressure medications rearranged, amlodipine will need to be discontinued, will add in hydrochlorothiazide as an alternative which should help somewhat the swelling.   [BM]    Clinical Course User Index [BM] Eber HongMiller, Jenavi Beedle, MD       There is no JVD, no pulmonary rales to suggest a pulmonary source of or cardiac source of peripheral edema.  He has had urinalysis performed twice in the last week neither of which showed protein in the urine, I will check his kidney function liver function and a BNP to rule out other sources of edema though I suspect that his is related to the use of amlodipine which is a known cause of peripheral edema.  The patient does not have any history of alcoholic or hepatitis cirrhosis, he is well-appearing otherwise and his back pain is improving significantly.  I doubt that this was a urinary tract infection, unfortunately no cultures were ever done prior to this visit so we are unable to verify that however given no urinary symptoms there is certainly no need to pursue treatment for urinary tract infection at this point.  The patient was informed that this was likely related to the amlodipine.  Final Clinical Impressions(s) / ED Diagnoses   Final diagnoses:   Peripheral edema    ED Discharge Orders         Ordered    hydrochlorothiazide (HYDRODIURIL) 25 MG tablet  Daily     06/07/19 0928           Eber HongMiller, Bobbi Kozakiewicz, MD 06/07/19 [phone]

## 2019-06-07 NOTE — ED Triage Notes (Signed)
Pt arrives from home complaining of back pain and swelling in his legs that started a week ago. Pt was seen by PCP and was treated for a kidney infection. Pt states medication did not help and made his feet swell.

## 2019-06-07 NOTE — Discharge Instructions (Signed)
Your testing was reassuring.  There is no signs of kidney failure, liver failure or heart failure.  I suspect that your swelling is related to the medication called amlodipine which is used for blood pressure.  It is known to cause leg swelling as well.  Please stop this medication immediately and substituted for hydrochlorothiazide.  After you start this new medication taking 1 tablet/day you will need to have your electrolytes and potassium checked at your doctor's office.  Please call today for an appointment to have this done within the next 7 days.  Seek a medical exam for severe or worsening symptoms.  Please keep your legs elevated and get some compression stockings from the local medical supply store which will help to get your swelling down.

## 2019-06-07 NOTE — ED Notes (Signed)
Patient verbalizes understanding of discharge instructions. Opportunity for questioning and answers were provided. Armband removed by staff, pt discharged from ED.  

## 2019-06-07 NOTE — ED Notes (Signed)
ED Provider at bedside. 

## 2019-06-14 ENCOUNTER — Ambulatory Visit: Payer: Medicare Other | Admitting: Nurse Practitioner

## 2019-06-20 ENCOUNTER — Ambulatory Visit (INDEPENDENT_AMBULATORY_CARE_PROVIDER_SITE_OTHER): Payer: Medicare Other | Admitting: Nurse Practitioner

## 2019-06-20 ENCOUNTER — Other Ambulatory Visit: Payer: Self-pay

## 2019-06-20 ENCOUNTER — Encounter: Payer: Self-pay | Admitting: Nurse Practitioner

## 2019-06-20 VITALS — BP 100/62 | HR 75 | Temp 98.5°F | Ht 67.8 in | Wt 231.4 lb

## 2019-06-20 DIAGNOSIS — M545 Low back pain, unspecified: Secondary | ICD-10-CM

## 2019-06-20 DIAGNOSIS — Z09 Encounter for follow-up examination after completed treatment for conditions other than malignant neoplasm: Secondary | ICD-10-CM

## 2019-06-20 DIAGNOSIS — E782 Mixed hyperlipidemia: Secondary | ICD-10-CM

## 2019-06-20 DIAGNOSIS — I10 Essential (primary) hypertension: Secondary | ICD-10-CM

## 2019-06-20 DIAGNOSIS — R609 Edema, unspecified: Secondary | ICD-10-CM

## 2019-06-20 MED ORDER — HYDROCHLOROTHIAZIDE 25 MG PO TABS: 25.0000 mg | ORAL_TABLET | Freq: Every day | ORAL | 1 refills | Status: DC

## 2019-06-20 NOTE — Patient Instructions (Signed)
Acute Back Pain, Adult Acute back pain is sudden and usually short-lived. It is often caused by an injury to the muscles and tissues in the back. The injury may result from:  A muscle or ligament getting overstretched or torn (strained). Ligaments are tissues that connect bones to each other. Lifting something improperly can cause a back strain.  Wear and tear (degeneration) of the spinal disks. Spinal disks are circular tissue that provides cushioning between the bones of the spine (vertebrae).  Twisting motions, such as while playing sports or doing yard work.  A hit to the back.  Arthritis. You may have a physical exam, lab tests, and imaging tests to find the cause of your pain. Acute back pain usually goes away with rest and home care. Follow these instructions at home: Managing pain, stiffness, and swelling  Take over-the-counter and prescription medicines only as told by your health care provider.  Your health care provider may recommend applying ice during the first 24-48 hours after your pain starts. To do this: ? Put ice in a plastic bag. ? Place a towel between your skin and the bag. ? Leave the ice on for 20 minutes, 2-3 times a day.  If directed, apply heat to the affected area as often as told by your health care provider. Use the heat source that your health care provider recommends, such as a moist heat pack or a heating pad. ? Place a towel between your skin and the heat source. ? Leave the heat on for 20-30 minutes. ? Remove the heat if your skin turns bright red. This is especially important if you are unable to feel pain, heat, or cold. You have a greater risk of getting burned. Activity   Do not stay in bed. Staying in bed for more than 1-2 days can delay your recovery.  Sit up and stand up straight. Avoid leaning forward when you sit, or hunching over when you stand. ? If you work at a desk, sit close to it so you do not need to lean over. Keep your chin tucked  in. Keep your neck drawn back, and keep your elbows bent at a right angle. Your arms should look like the letter "L." ? Sit high and close to the steering wheel when you drive. Add lower back (lumbar) support to your car seat, if needed.  Take short walks on even surfaces as soon as you are able. Try to increase the length of time you walk each day.  Do not sit, drive, or stand in one place for more than 30 minutes at a time. Sitting or standing for long periods of time can put stress on your back.  Do not drive or use heavy machinery while taking prescription pain medicine.  Use proper lifting techniques. When you bend and lift, use positions that put less stress on your back: ? Bend your knees. ? Keep the load close to your body. ? Avoid twisting.  Exercise regularly as told by your health care provider. Exercising helps your back heal faster and helps prevent back injuries by keeping muscles strong and flexible.  Work with a physical therapist to make a safe exercise program, as recommended by your health care provider. Do any exercises as told by your physical therapist. Lifestyle  Maintain a healthy weight. Extra weight puts stress on your back and makes it difficult to have good posture.  Avoid activities or situations that make you feel anxious or stressed. Stress and anxiety increase muscle   tension and can make back pain worse. Learn ways to manage anxiety and stress, such as through exercise. General instructions  Sleep on a firm mattress in a comfortable position. Try lying on your side with your knees slightly bent. If you lie on your back, put a pillow under your knees.  Follow your treatment plan as told by your health care provider. This may include: ? Cognitive or behavioral therapy. ? Acupuncture or massage therapy. ? Meditation or yoga. Contact a health care provider if:  You have pain that is not relieved with rest or medicine.  You have increasing pain going down  into your legs or buttocks.  Your pain does not improve after 2 weeks.  You have pain at night.  You lose weight without trying.  You have a fever or chills. Get help right away if:  You develop new bowel or bladder control problems.  You have unusual weakness or numbness in your arms or legs.  You develop nausea or vomiting.  You develop abdominal pain.  You feel faint. Summary  Acute back pain is sudden and usually short-lived.  Use proper lifting techniques. When you bend and lift, use positions that put less stress on your back.  Take over-the-counter and prescription medicines and apply heat or ice as directed by your health care provider. This information is not intended to replace advice given to you by your health care provider. Make sure you discuss any questions you have with your health care provider. Document Released: 12/12/2005 Document Revised: 07/19/2018 Document Reviewed: 07/26/2017 Elsevier Interactive Patient Education  2019 Elsevier Inc.   Back Exercises If you have pain in your back, do these exercises 2-3 times each day or as told by your doctor. When the pain goes away, do the exercises once each day, but repeat the steps more times for each exercise (do more repetitions). If you do not have pain in your back, do these exercises once each day or as told by your doctor. Exercises Single Knee to Chest Do these steps 3-5 times in a row for each leg: 1. Lie on your back on a firm bed or the floor with your legs stretched out. 2. Bring one knee to your chest. 3. Hold your knee to your chest by grabbing your knee or thigh. 4. Pull on your knee until you feel a gentle stretch in your lower back. 5. Keep doing the stretch for 10-30 seconds. 6. Slowly let go of your leg and straighten it. Pelvic Tilt Do these steps 5-10 times in a row: 1. Lie on your back on a firm bed or the floor with your legs stretched out. 2. Bend your knees so they point up to the  ceiling. Your feet should be flat on the floor. 3. Tighten your lower belly (abdomen) muscles to press your lower back against the floor. This will make your tailbone point up to the ceiling instead of pointing down to your feet or the floor. 4. Stay in this position for 5-10 seconds while you gently tighten your muscles and breathe evenly. Cat-Cow Do these steps until your lower back bends more easily: 1. Get on your hands and knees on a firm surface. Keep your hands under your shoulders, and keep your knees under your hips. You may put padding under your knees. 2. Let your head hang down, and make your tailbone point down to the floor so your lower back is round like the back of a cat. 3. Stay in this  position for 5 seconds. 4. Slowly lift your head and make your tailbone point up to the ceiling so your back hangs low (sags) like the back of a cow. 5. Stay in this position for 5 seconds.  Press-Ups Do these steps 5-10 times in a row: 1. Lie on your belly (face-down) on the floor. 2. Place your hands near your head, about shoulder-width apart. 3. While you keep your back relaxed and keep your hips on the floor, slowly straighten your arms to raise the top half of your body and lift your shoulders. Do not use your back muscles. To make yourself more comfortable, you may change where you place your hands. 4. Stay in this position for 5 seconds. 5. Slowly return to lying flat on the floor.  Bridges Do these steps 10 times in a row: 1. Lie on your back on a firm surface. 2. Bend your knees so they point up to the ceiling. Your feet should be flat on the floor. 3. Tighten your butt muscles and lift your butt off of the floor until your waist is almost as high as your knees. If you do not feel the muscles working in your butt and the back of your thighs, slide your feet 1-2 inches farther away from your butt. 4. Stay in this position for 3-5 seconds. 5. Slowly lower your butt to the floor, and  let your butt muscles relax. If this exercise is too easy, try doing it with your arms crossed over your chest. Belly Crunches Do these steps 5-10 times in a row: 1. Lie on your back on a firm bed or the floor with your legs stretched out. 2. Bend your knees so they point up to the ceiling. Your feet should be flat on the floor. 3. Cross your arms over your chest. 4. Tip your chin a little bit toward your chest but do not bend your neck. 5. Tighten your belly muscles and slowly raise your chest just enough to lift your shoulder blades a tiny bit off of the floor. 6. Slowly lower your chest and your head to the floor. Back Lifts Do these steps 5-10 times in a row: 1. Lie on your belly (face-down) with your arms at your sides, and rest your forehead on the floor. 2. Tighten the muscles in your legs and your butt. 3. Slowly lift your chest off of the floor while you keep your hips on the floor. Keep the back of your head in line with the curve in your back. Look at the floor while you do this. 4. Stay in this position for 3-5 seconds. 5. Slowly lower your chest and your face to the floor. Contact a doctor if:  Your back pain gets a lot worse when you do an exercise.  Your back pain does not lessen 2 hours after you exercise. If you have any of these problems, stop doing the exercises. Do not do them again unless your doctor says it is okay. Get help right away if:  You have sudden, very bad back pain. If this happens, stop doing the exercises. Do not do them again unless your doctor says it is okay. This information is not intended to replace advice given to you by your health care provider. Make sure you discuss any questions you have with your health care provider. Document Released: 01/14/2011 Document Revised: 09/05/2018 Document Reviewed: 02/05/2015 Elsevier Interactive Patient Education  [employer]2019 Else[employer] Inc.    Encouraged to get a new mattress  these should be changed every 8-10  years.

## 2019-06-20 NOTE — Progress Notes (Signed)
Subjective:     Patient ID: Wesley Webb , male    DOB: 06/24/1946 , 73 y.o.   MRN: [id number]   Chief Complaint  Patient presents with  . Hypertension  . Hyperlipidemia  . Hospitalization Follow-up    patient went to the ER for leg swelling    HPI  He was discontinued from the amlodipine and accupril.  He is now on HCTZ, he feels the swelling is about the same.  He feels a little better.  He will have stiffness upon awakening.    Hypertension This is a chronic problem. The current episode started more than 1 year ago. The problem is unchanged. The problem is controlled. Pertinent negatives include no anxiety, chest pain, headaches or palpitations. There are no associated agents to hypertension. Risk factors for coronary artery disease include sedentary lifestyle, obesity and male gender. Past treatments include diuretics. There are no compliance problems.  There is no history of angina. There is no history of chronic renal disease.  Hyperlipidemia He has no history of chronic renal disease. Pertinent negatives include no chest pain.     Past Medical History:  Diagnosis Date  . Brain aneurysm   . Hypertension      Family History  Problem Relation Age of Onset  . Glaucoma Mother      Current Outpatient Medications:  .  COMBIGAN 0.2-0.5 % ophthalmic solution, , Disp: , Rfl:  .  hydrochlorothiazide (HYDRODIURIL) 25 MG tablet, Take 1 tablet (25 mg total) by mouth daily., Disp: 30 tablet, Rfl: 0 .  ibuprofen (ADVIL) 800 MG tablet, Take 1 tablet (800 mg total) by mouth every 8 (eight) hours as needed., Disp: 30 tablet, Rfl: 0 .  LUMIGAN 0.01 % SOLN, INSTILL 1 DROP INTO EACH EYE EVERY EVENING, Disp: , Rfl:    No Known Allergies   Review of Systems  Constitutional: Negative.   Respiratory: Negative.   Cardiovascular: Negative.  Negative for chest pain, palpitations and leg swelling.  Musculoskeletal: Positive for arthralgias and back pain (worse in the morning).   Neurological: Negative for dizziness and headaches.     Today's Vitals   06/20/19 1059  BP: 100/62  Pulse: 75  Temp: 98.5 F (36.9 C)  TempSrc: Oral  Weight: 231 lb 6.4 oz (105 kg)  Height: 5' 7.8" (1.722 m)  PainSc: 0-No pain   Body mass index is 35.39 kg/m.   Objective:  Physical Exam Vitals signs reviewed.  Constitutional:      Appearance: Normal appearance.  Cardiovascular:     Rate and Rhythm: Normal rate and regular rhythm.  Musculoskeletal:     Right lower leg: Edema (trace) present.     Left lower leg: Edema (trace) present.  Skin:    General: Skin is warm.  Neurological:     General: No focal deficit present.     Mental Status: He is alert and oriented to person, place, and time.  Psychiatric:        Mood and Affect: Mood normal.        Behavior: Behavior normal.        Thought Content: Thought content normal.        Judgment: Judgment normal.         Assessment And Plan:     1. Acute bilateral low back pain without sciatica  Continues to have low back pain   I will refer for PT for stretching and strengthening exercises - Ambulatory referral to Physical Therapy  2. Essential hypertension .  B/P is well controlled.  . Continue with current medications, was changed from amlodipine to HCTZ after going to ER - hydrochlorothiazide (HYDRODIURIL) 25 MG tablet; Take 1 tablet (25 mg total) by mouth daily.  Dispense: 90 tablet; Refill: 1  3. Mixed hyperlipidemia  Chronic, controlled  Continue with current medications  4. Edema, unspecified type  Bilateral lower extremity edema   Encouraged to eat foods high in potassium - hydrochlorothiazide (HYDRODIURIL) 25 MG tablet; Take 1 tablet (25 mg total) by mouth daily.  Dispense: 90 tablet; Refill: 1   Minette Brine, FNP    THE PATIENT IS ENCOURAGED TO PRACTICE SOCIAL DISTANCING DUE TO THE COVID-19 PANDEMIC.

## 2019-08-08 ENCOUNTER — Encounter: Payer: Self-pay | Admitting: Family Medicine

## 2019-08-08 ENCOUNTER — Encounter: Payer: Medicare Other | Admitting: Family Medicine

## 2019-08-08 ENCOUNTER — Other Ambulatory Visit: Payer: Self-pay

## 2019-08-08 NOTE — Patient Instructions (Signed)
Health Maintenance Due  Topic Date Due  . INFLUENZA VACCINE  07/27/2019    Depression screen [facility] 2/9 05/30/2019 03/14/2019  Decreased Interest 0 0  Down, Depressed, Hopeless 0 0  PHQ - 2 Score 0 0  Altered sleeping - 0  Tired, decreased energy - 0  Change in appetite - 0  Feeling bad or failure about yourself  - 0  Trouble concentrating - 0  Moving slowly or fidgety/restless - 0  Suicidal thoughts - 0  PHQ-9 Score - 0

## 2019-08-09 NOTE — Progress Notes (Signed)
Pt scheduled for [facility] visit, however states he has a pcp at [facility].  Pt states he is being seen for a 2nd opinion.  Pt's daughter made the appointment.  Pt states he wants medicine for his back pain.  Just started PT 1 wk ago.  Per pt, if you can help my back pain I will change providers.  Pt advised this provider does not do 2nd opinions or chronic pain management.  Pt advised to have a discussion with his current pcp.  Pt did not establish care.  Grier Mitts, MD

## 2019-08-13 ENCOUNTER — Telehealth: Payer: Self-pay

## 2019-08-13 ENCOUNTER — Ambulatory Visit: Payer: Medicare Other | Admitting: Nurse Practitioner

## 2019-08-13 NOTE — Telephone Encounter (Signed)
Patient's daughter Mearl Latin called concerning pt she wanted to know what was going on with patient's back. I have advised pt that we are unable to discuss anything with her because she is not on the HIPPA list. I have advised her that Patient's wife can give [place] a call and we can speak with her. YRL,RMA

## 2019-08-28 ENCOUNTER — Telehealth: Payer: Self-pay

## 2019-08-28 NOTE — Telephone Encounter (Signed)
Left message. Called to schedule appt with pt

## 2019-08-31 ENCOUNTER — Other Ambulatory Visit: Payer: Self-pay | Admitting: Nurse Practitioner

## 2019-08-31 DIAGNOSIS — R109 Unspecified abdominal pain: Secondary | ICD-10-CM

## 2019-09-04 ENCOUNTER — Other Ambulatory Visit: Payer: Self-pay | Admitting: Nurse Practitioner

## 2019-09-05 ENCOUNTER — Other Ambulatory Visit: Payer: Self-pay

## 2019-09-05 ENCOUNTER — Encounter: Payer: Self-pay | Admitting: Nurse Practitioner

## 2019-09-05 ENCOUNTER — Ambulatory Visit (INDEPENDENT_AMBULATORY_CARE_PROVIDER_SITE_OTHER): Payer: Medicare Other | Admitting: Nurse Practitioner

## 2019-09-05 VITALS — BP 128/78 | HR 76 | Temp 98.9°F | Ht 67.8 in | Wt 231.4 lb

## 2019-09-05 DIAGNOSIS — G8929 Other chronic pain: Secondary | ICD-10-CM | POA: Diagnosis not present

## 2019-09-05 DIAGNOSIS — M545 Low back pain, unspecified: Secondary | ICD-10-CM

## 2019-09-05 DIAGNOSIS — I671 Cerebral aneurysm, nonruptured: Secondary | ICD-10-CM

## 2019-09-05 DIAGNOSIS — Z8679 Personal history of other diseases of the circulatory system: Secondary | ICD-10-CM

## 2019-09-05 DIAGNOSIS — E782 Mixed hyperlipidemia: Secondary | ICD-10-CM

## 2019-09-05 DIAGNOSIS — I10 Essential (primary) hypertension: Secondary | ICD-10-CM

## 2019-09-05 DIAGNOSIS — R7303 Prediabetes: Secondary | ICD-10-CM

## 2019-09-05 MED ORDER — KETOROLAC TROMETHAMINE 60 MG/2ML IM SOLN
60.0000 mg | Freq: Once | INTRAMUSCULAR | Status: AC
  Administered 2019-09-05: 60 mg via INTRAMUSCULAR

## 2019-09-05 MED ORDER — DICLOFENAC SODIUM 1 % TD GEL: 2.0000 g | Freq: Four times a day (QID) | TRANSDERMAL | 2 refills | Status: DC
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
## 2019-09-06 ENCOUNTER — Other Ambulatory Visit: Payer: Self-pay | Admitting: Nurse Practitioner

## 2019-09-06 LAB — LIPID PANEL
Chol/HDL Ratio: 4.2 ratio (ref 0.0–5.0)
Cholesterol, Total: 160 mg/dL (ref 100–199)
HDL: 38 mg/dL — ABNORMAL LOW (ref >39)
LDL Chol Calc (NIH): 92 mg/dL (ref 0–99)
Triglycerides: 172 mg/dL — ABNORMAL HIGH (ref 0–149)
VLDL Cholesterol Cal: 30 mg/dL (ref 5–40)

## 2019-09-06 LAB — BMP8+EGFR
BUN/Creatinine Ratio: 17 (ref 10–24)
BUN: 17 mg/dL (ref 8–27)
CO2: 28 mmol/L (ref 20–29)
Calcium: 9.6 mg/dL (ref 8.6–10.2)
Chloride: 104 mmol/L (ref 96–106)
Creatinine, Ser: 1.01 mg/dL (ref 0.76–1.27)
GFR calc Af Amer: 86 mL/min/{1.73_m2} (ref >59)
GFR calc non Af Amer: 74 mL/min/{1.73_m2} (ref >59)
Glucose: 120 mg/dL — ABNORMAL HIGH (ref 65–99)
Potassium: 4.2 mmol/L (ref 3.5–5.2)
Sodium: 142 mmol/L (ref 134–144)

## 2019-09-06 LAB — HEMOGLOBIN A1C
Est. average glucose Bld gHb Est-mCnc: 128 mg/dL
Hgb A1c MFr Bld: 6.1 % — ABNORMAL HIGH (ref 4.8–5.6)

## 2019-09-06 LAB — CBC
Hematocrit: 34 % — ABNORMAL LOW (ref 37.5–51.0)
Hemoglobin: 11.4 g/dL — ABNORMAL LOW (ref 13.0–17.7)
MCH: 30.3 pg (ref 26.6–33.0)
MCHC: 33.5 g/dL (ref 31.5–35.7)
MCV: 90 fL (ref 79–97)
Platelets: 209 10*3/uL (ref 150–450)
RBC: 3.76 x10E6/uL — ABNORMAL LOW (ref 4.14–5.80)
RDW: 13.2 % (ref 11.6–15.4)
WBC: 5.6 10*3/uL (ref 3.4–10.8)

## 2019-09-23 ENCOUNTER — Ambulatory Visit: Payer: Medicare Other | Admitting: Nurse Practitioner

## 2019-10-10 ENCOUNTER — Other Ambulatory Visit: Payer: Self-pay

## 2019-10-10 ENCOUNTER — Ambulatory Visit (HOSPITAL_COMMUNITY)
Admission: EM | Admit: 2019-10-10 | Discharge: 2019-10-10 | Disposition: A | Discharge status: Home / Self Care | Payer: Medicare Other

## 2019-10-10 ENCOUNTER — Encounter (HOSPITAL_COMMUNITY): Payer: Self-pay

## 2019-10-10 DIAGNOSIS — R609 Edema, unspecified: Secondary | ICD-10-CM | POA: Diagnosis not present

## 2019-10-10 NOTE — ED Triage Notes (Signed)
Patient presents to Urgent Care with complaints of bilateral feet swelling since his BP medicine was changed after a visit to the ED for back pain about a month ago. Patient reports his PCP has checked his blood work and did not change his meds, states his legs have been swollen the past several days.

## 2019-10-10 NOTE — ED Provider Notes (Signed)
[facility]    CSN: [id number] Arrival date & time: 10/10/19  4098      History   Chief Complaint Chief Complaint  Patient presents with  . Leg Swelling    HPI Wesley Webb is a 73 y.o. male.   Patient presents with bilateral lower extremity swelling x4 months but worse this week.  Patient states he ate a lot of salty foods over the weekend (canned soup, vienna sausages).  He was seen in the ED on 06/07/2019; amlodipine discontinued and patient started on HCTZ.  He was seen by his PCP on 09/05/2019; electrolytes were normal at that visit.  He denies dizziness, headache, weakness, chest pain, shortness of breath, or other symptoms.  HPI  Past Medical History:  Diagnosis Date  . Brain aneurysm   . Hypertension     Patient Active Problem List   Diagnosis Date Noted  . Urinary urgency 05/30/2019  . Mixed hyperlipidemia 09/17/2018  . Essential hypertension 09/17/2018    Past Surgical History:  Procedure Laterality Date  . SHOULDER SURGERY    . 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 Authorizing Provider  hydrochlorothiazide (HYDRODIURIL) 25 MG tablet Take 1 tablet (25 mg total) by mouth daily. 06/20/19  Yes Minette Brine, FNP  COMBIGAN 0.2-0.5 % ophthalmic solution  02/21/19   [provider]  diclofenac sodium (VOLTAREN) 1 % GEL Apply 2 g topically 4 (four) times daily. 09/05/19   Minette Brine, FNP  ibuprofen (ADVIL) 800 MG tablet TAKE 1 TABLET BY MOUTH EVERY 8 HOURS AS NEEDED 09/02/19   Minette Brine, FNP  LUMIGAN 0.01 % SOLN INSTILL 1 DROP INTO [facility] EYE EVERY EVENING 03/06/19   [provider]  amLODipine (NORVASC) 10 MG tablet Take 1 tablet by mouth once daily 05/21/19 06/07/19  Minette Brine, FNP    Family History Family History  Problem Relation Age of Onset  . Glaucoma Mother   . Hypertension Mother   . Hypertension Father   . Cancer Father    . Alcohol abuse Father   . Kidney disease Brother     Social History Social History   Tobacco Use  . Smoking status: Never Smoker  . Smokeless tobacco: Never Used  Substance Use Topics  . Alcohol use: No  . Drug use: No     Allergies   Patient has no known allergies.   Review of Systems Review of Systems   Physical Exam Triage Vital Signs ED Triage Vitals  Enc Vitals Group     BP 10/10/19 1019 (!) 143/76     Pulse --      Resp 10/10/19 1019 18     Temp 10/10/19 1019 98.1 F (36.7 C)     Temp Source 10/10/19 1019 Oral     SpO2 10/10/19 1019 97 %     Weight --      Height --      Head Circumference --      Peak Flow --      Pain Score 10/10/19 1017 0     Pain Loc --      Pain Edu? --      Excl. in [place]? --    No data found.  Updated Vital Signs BP (!) 143/76 (BP Location: Right Arm)   Temp 98.1 F (36.7 C) (Oral)   Resp 18   SpO2 97%  Visual Acuity Right Eye Distance:   Left Eye Distance:   Bilateral Distance:    Right Eye Near:   Left Eye Near:    Bilateral Near:     Physical Exam   UC Treatments / Results  Labs (all labs ordered are listed, but only abnormal results are displayed) Labs Reviewed - No data to display  EKG   Radiology No results found.  Procedures Procedures (including critical care time)  Medications Ordered in UC Medications - No data to display  Initial Impression / Assessment and Plan / UC Course  I have reviewed the triage vital signs and the nursing notes.  Pertinent labs & imaging results that were available during my care of the patient were reviewed by me and considered in my medical decision making (see chart for details).    Lower extremity edema.  Instructed patient to follow a low-sodium diet.  Instructed him to elevate his legs during the day as he is able to.  Instructed him to continue his current medication and follow-up with his PCP tomorrow.  Patient agrees to plan of care.     Final Clinical  Impressions(s) / UC Diagnoses   Final diagnoses:  Peripheral edema     Discharge Instructions     Follow a low salt diet.    Elevate your legs as you are able to during the day.    Continue to take your current medications and follow-up with your primary care provider in the morning.        ED Prescriptions    None     PDMP not reviewed this encounter.   Mickie Bail, NP 10/10/19 1053

## 2019-10-10 NOTE — Discharge Instructions (Addendum)
Follow a low salt diet.    Elevate your legs as you are able to during the day.    Continue to take your current medications and follow-up with your primary care provider in the morning.

## 2020-02-17 ENCOUNTER — Ambulatory Visit: Payer: Medicare Other | Attending: Family

## 2020-02-17 DIAGNOSIS — Z23 Encounter for immunization: Secondary | ICD-10-CM | POA: Insufficient documentation

## 2020-02-17 NOTE — Progress Notes (Signed)
   Covid-19 Vaccination Clinic  Name:  Wesley Webb    MRN: [id number] DOB: 08-19-1946  02/17/2020  Mr. Arnett was observed post Covid-19 immunization for 15 minutes without incidence. He was provided with Vaccine Information Sheet and instruction to access the V-Safe system.   Mr. Matera was instructed to call 911 with any severe reactions post vaccine: Marland Kitchen Difficulty breathing  . Swelling of your face and throat  . A fast heartbeat  . A bad rash all over your body  . Dizziness and weakness    Immunizations Administered    Name Date Dose VIS Date Route   Moderna COVID-19 Vaccine 02/17/2020  4:01 PM 0.5 mL 11/26/2019 Intramuscular   Manufacturer: Moderna   Lot: [id number]   NDC: 99068-934-06

## 2020-03-05 DIAGNOSIS — H401133 Primary open-angle glaucoma, bilateral, severe stage: Secondary | ICD-10-CM | POA: Diagnosis not present

## 2020-03-10 ENCOUNTER — Encounter: Payer: Self-pay | Admitting: Nurse Practitioner

## 2020-03-10 ENCOUNTER — Ambulatory Visit (INDEPENDENT_AMBULATORY_CARE_PROVIDER_SITE_OTHER): Payer: Medicare Other | Admitting: Nurse Practitioner

## 2020-03-10 ENCOUNTER — Other Ambulatory Visit: Payer: Self-pay

## 2020-03-10 VITALS — BP 122/84 | HR 76 | Temp 98.0°F | Ht 67.8 in | Wt 232.0 lb

## 2020-03-10 DIAGNOSIS — E782 Mixed hyperlipidemia: Secondary | ICD-10-CM | POA: Diagnosis not present

## 2020-03-10 DIAGNOSIS — I10 Essential (primary) hypertension: Secondary | ICD-10-CM

## 2020-03-10 DIAGNOSIS — R7303 Prediabetes: Secondary | ICD-10-CM | POA: Diagnosis not present

## 2020-03-10 DIAGNOSIS — R609 Edema, unspecified: Secondary | ICD-10-CM | POA: Diagnosis not present

## 2020-03-10 MED ORDER — FUROSEMIDE 20 MG PO TABS: ORAL_TABLET | ORAL | 0 refills | Status: DC

## 2020-03-10 NOTE — Progress Notes (Signed)
This visit occurred during the SARS-CoV-2 public health emergency.  Safety protocols were in place, including screening questions prior to the visit, additional usage of staff PPE, and extensive cleaning of exam room while observing appropriate contact time as indicated for disinfecting solutions.  Subjective:     Patient ID: Wesley Webb , male    DOB: 27-Sep-1946 , 74 y.o.   MRN: [id number]   Chief Complaint  Patient presents with  . Leg Swelling    both feet swollen     HPI  Bilateral feet edema - he had stopped taking his medications as regularly in the last few weeks, he noticed his ankles swelling.  He would drink coffee and eating sausage biscuits. Denies shortness of breath.  He did get some shortness of breath yesterday going up stairs.  He will eat out at restaraunts.  Denies chest pain  He does have improvement of the swelling in the morning    Past Medical History:  Diagnosis Date  . Brain aneurysm   . Hypertension      Family History  Problem Relation Age of Onset  . Glaucoma Mother   . Hypertension Mother   . Hypertension Father   . Cancer Father   . Alcohol abuse Father   . Kidney disease Brother      Current Outpatient Medications:  .  COMBIGAN 0.2-0.5 % ophthalmic solution, , Disp: , Rfl:  .  diclofenac sodium (VOLTAREN) 1 % GEL, Apply 2 g topically 4 (four) times daily., Disp: 100 g, Rfl: 2 .  hydrochlorothiazide (HYDRODIURIL) 25 MG tablet, Take 1 tablet (25 mg total) by mouth daily., Disp: 90 tablet, Rfl: 1 .  ibuprofen (ADVIL) 800 MG tablet, TAKE 1 TABLET BY MOUTH EVERY 8 HOURS AS NEEDED, Disp: 30 tablet, Rfl: 0 .  LUMIGAN 0.01 % SOLN, INSTILL 1 DROP INTO EACH EYE EVERY EVENING, Disp: , Rfl:    No Known Allergies   Review of Systems  Constitutional: Negative.  Negative for fatigue.  Respiratory: Negative.  Negative for cough.   Cardiovascular: Positive for leg swelling (bilateral feet and ankles). Negative for chest pain and palpitations.   Gastrointestinal: Negative for nausea.  Endocrine: Negative for polydipsia, polyphagia and polyuria.  Neurological: Negative for dizziness and headaches.  Psychiatric/Behavioral: Negative.      Today's Vitals   03/10/20 1020  BP: 122/84  Pulse: 76  Temp: 98 F (36.7 C)  TempSrc: Oral  SpO2: 94%  Weight: 232 lb (105.2 kg)  Height: 5' 7.8" (1.722 m)   Body mass index is 35.48 kg/m.   Objective:  Physical Exam Constitutional:      Appearance: Normal appearance.  Cardiovascular:     Rate and Rhythm: Normal rate and regular rhythm.     Pulses: Normal pulses.     Heart sounds: Normal heart sounds. No murmur.  Pulmonary:     Effort: Pulmonary effort is normal. No respiratory distress.     Breath sounds: Normal breath sounds.  Musculoskeletal:        General: Swelling (1+ edema bilateral lower extremities) present. No tenderness or deformity.     Right lower leg: Edema (1+ non pitting edema) present.     Left lower leg: Edema (1+ non-pitting edema) present.  Skin:    Capillary Refill: Capillary refill takes less than 2 seconds.  Neurological:     General: No focal deficit present.     Mental Status: He is alert and oriented to person, place, and time.  Cranial Nerves: No cranial nerve deficit.  Psychiatric:        Mood and Affect: Mood normal.        Behavior: Behavior normal.        Thought Content: Thought content normal.        Judgment: Judgment normal.         Assessment And Plan:     1. Edema, unspecified type  Bilateral lower extremity edema 1+, nontender  He is to take the furosemide for 3 days and hold the HCTZ until Saturday.    Advised to elevate feet when possible  Avoid high salt diet - furosemide (LASIX) 20 MG tablet; Take 1 tablet by mouth daily x 3 days.  Dispense: 30 tablet; Refill: 0 - BMP8+eGFR  2. Essential hypertension  Chronic, good control  Continue with current medications - BMP8+eGFR  3. Mixed hyperlipidemia  Chronic,  controlled  No current medications - Lipid panel  4. Prediabetes  Chronic, stable  No current medications  Will check HgbA1c and encouraged to limit intake of sugary foods and drinks - Hemoglobin A1c   Minette Brine, FNP    THE PATIENT IS ENCOURAGED TO PRACTICE SOCIAL DISTANCING DUE TO THE COVID-19 PANDEMIC.

## 2020-03-10 NOTE — Patient Instructions (Signed)
Take furosemide 20 mg once a day for 3 days, stop taking hydrochlorthiazide for 3 days On Saturday start back on hydrochlorthiazide.daily.

## 2020-03-11 ENCOUNTER — Ambulatory Visit: Payer: Medicare Other | Admitting: Nurse Practitioner

## 2020-03-11 LAB — LIPID PANEL
Chol/HDL Ratio: 3.8 ratio (ref 0.0–5.0)
Cholesterol, Total: 153 mg/dL (ref 100–199)
HDL: 40 mg/dL (ref >39)
LDL Chol Calc (NIH): 96 mg/dL (ref 0–99)
Triglycerides: 92 mg/dL (ref 0–149)
VLDL Cholesterol Cal: 17 mg/dL (ref 5–40)

## 2020-03-11 LAB — BMP8+EGFR
BUN/Creatinine Ratio: 13 (ref 10–24)
BUN: 14 mg/dL (ref 8–27)
CO2: 26 mmol/L (ref 20–29)
Calcium: 9.5 mg/dL (ref 8.6–10.2)
Chloride: 102 mmol/L (ref 96–106)
Creatinine, Ser: 1.12 mg/dL (ref 0.76–1.27)
GFR calc Af Amer: 75 mL/min/{1.73_m2} (ref >59)
GFR calc non Af Amer: 65 mL/min/{1.73_m2} (ref >59)
Glucose: 161 mg/dL — ABNORMAL HIGH (ref 65–99)
Potassium: 4.5 mmol/L (ref 3.5–5.2)
Sodium: 140 mmol/L (ref 134–144)

## 2020-03-11 LAB — HEMOGLOBIN A1C
Est. average glucose Bld gHb Est-mCnc: 151 mg/dL
Hgb A1c MFr Bld: 6.9 % — ABNORMAL HIGH (ref 4.8–5.6)

## 2020-03-17 ENCOUNTER — Ambulatory Visit: Payer: Medicare Other | Attending: Family

## 2020-03-17 DIAGNOSIS — Z23 Encounter for immunization: Secondary | ICD-10-CM

## 2020-03-17 NOTE — Progress Notes (Signed)
   Covid-19 Vaccination Clinic  Name:  Jaelan Rasheed    MRN: [id number] DOB: 1946-07-01  03/17/2020  Mr. Galentine was observed post Covid-19 immunization for 15 minutes without incident. He was provided with Vaccine Information Sheet and instruction to access the V-Safe system.   Mr. Plotts was instructed to call 911 with any severe reactions post vaccine: Marland Kitchen Difficulty breathing  . Swelling of face and throat  . A fast heartbeat  . A bad rash all over body  . Dizziness and weakness   Immunizations Administered    Name Date Dose VIS Date Route   Moderna COVID-19 Vaccine 03/17/2020  3:08 PM 0.5 mL 11/26/2019 Intramuscular   Manufacturer: Moderna   Lot: [id number]   NDC: 69485-462-70

## 2020-03-18 ENCOUNTER — Ambulatory Visit (INDEPENDENT_AMBULATORY_CARE_PROVIDER_SITE_OTHER): Payer: Medicare Other

## 2020-03-18 ENCOUNTER — Other Ambulatory Visit: Payer: Self-pay

## 2020-03-18 ENCOUNTER — Ambulatory Visit (INDEPENDENT_AMBULATORY_CARE_PROVIDER_SITE_OTHER): Payer: Medicare Other | Admitting: Nurse Practitioner

## 2020-03-18 ENCOUNTER — Encounter: Payer: Self-pay | Admitting: Nurse Practitioner

## 2020-03-18 VITALS — BP 124/82 | HR 60 | Temp 98.5°F | Ht 67.2 in | Wt 229.0 lb

## 2020-03-18 VITALS — BP 124/82 | HR 60 | Temp 98.5°F | Ht 67.2 in | Wt 229.6 lb

## 2020-03-18 DIAGNOSIS — Z1159 Encounter for screening for other viral diseases: Secondary | ICD-10-CM

## 2020-03-18 DIAGNOSIS — R7303 Prediabetes: Secondary | ICD-10-CM

## 2020-03-18 DIAGNOSIS — E782 Mixed hyperlipidemia: Secondary | ICD-10-CM

## 2020-03-18 DIAGNOSIS — Z0000 Encounter for general adult medical examination without abnormal findings: Secondary | ICD-10-CM

## 2020-03-18 DIAGNOSIS — R609 Edema, unspecified: Secondary | ICD-10-CM

## 2020-03-18 DIAGNOSIS — Z1211 Encounter for screening for malignant neoplasm of colon: Secondary | ICD-10-CM

## 2020-03-18 DIAGNOSIS — I10 Essential (primary) hypertension: Secondary | ICD-10-CM | POA: Diagnosis not present

## 2020-03-18 DIAGNOSIS — N390 Urinary tract infection, site not specified: Secondary | ICD-10-CM

## 2020-03-18 NOTE — Progress Notes (Signed)
This visit occurred during the SARS-CoV-2 public health emergency.  Safety protocols were in place, including screening questions prior to the visit, additional usage of staff PPE, and extensive cleaning of exam room while observing appropriate contact time as indicated for disinfecting solutions.  Subjective:   Wesley Webb is a 74 y.o. male who presents for Medicare Annual/Subsequent preventive examination.  Review of Systems:  n/a Cardiac Risk Factors include: advanced age (>38men, >11 women);dyslipidemia;hypertension;male gender;obesity (BMI >30kg/m2);sedentary lifestyle     Objective:    Vitals: BP 124/82 (BP Location: Left Arm, Patient Position: Sitting, Cuff Size: Large)   Pulse 60   Temp 98.5 F (36.9 C) (Oral)   Ht 5' 7.2" (1.707 m)   Wt 229 lb (103.9 kg)   BMI 35.65 kg/m   Body mass index is 35.65 kg/m.  Advanced Directives 03/18/2020 06/07/2019 03/14/2019  Does Patient Have a Medical Advance Directive? Yes No No  Type of Estate agent of [place];Living will - -  Copy of Healthcare Power of Attorney in Chart? No - copy requested - -  Would patient like information on creating a medical advance directive? - - No - Patient declined    Tobacco Social History   Tobacco Use  Smoking Status Never Smoker  Smokeless Tobacco Never Used     Counseling given: Not Answered   Clinical Intake:  Pre-visit preparation completed: Yes  Pain : No/denies pain     Nutritional Status: BMI > 30  Obese Nutritional Risks: None Diabetes: No  How often do you need to have someone help you when you read instructions, pamphlets, or other written materials from your doctor or pharmacy?: 1 - Never What is the last grade level you completed in school?: 12th grade  Interpreter Needed?: No  Information entered by :: NAllen LPN  Past Medical History:  Diagnosis Date  . Brain aneurysm   . Hypertension    Past Surgical History:  Procedure Laterality  Date  . SHOULDER SURGERY    . 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     Family History  Problem Relation Age of Onset  . Glaucoma Mother   . Hypertension Mother   . Hypertension Father   . Cancer Father   . Alcohol abuse Father   . Kidney disease Brother    Social History   Socioeconomic History  . Marital status: Married    Spouse name: Not on file  . Number of children: Not on file  . Years of education: Not on file  . Highest education level: Not on file  Occupational History  . Occupation: works part Research officer, trade union  . Smoking status: Never Smoker  . Smokeless tobacco: Never Used  Substance and Sexual Activity  . Alcohol use: No  . Drug use: No  . Sexual activity: Yes  Other Topics Concern  . Not on file  Social History Narrative  . Not on file   Social Determinants of Health   Financial Resource Strain: Low Risk   . Difficulty of Paying Living Expenses: Not hard at all  Food Insecurity: No Food Insecurity  . Worried About Programme researcher, broadcasting/film/video in the Last Year: Never true  . Ran Out of Food in the Last Year: Never true  Transportation Needs: No Transportation Needs  . Lack of Transportation (Medical): No  . Lack of Transportation (Non-Medical): No  Physical Activity: Inactive  . Days of Exercise per Week: 0 days  . Minutes of Exercise per Session: 0 min  Stress: No Stress Concern Present  . Feeling of Stress : Not at all  Social Connections:   . Frequency of Communication with Friends and Family:   . Frequency of Social Gatherings with Friends and Family:   . Attends Religious Services:   . Active Member of Clubs or Organizations:   . Attends Banker Meetings:   Marland Kitchen Marital Status:     Outpatient Encounter Medications as of 03/18/2020  Medication Sig  . COMBIGAN 0.2-0.5 % ophthalmic solution   . diclofenac sodium (VOLTAREN) 1 % GEL Apply 2 g topically 4 (four) times daily.  . furosemide (LASIX) 20 MG tablet Take 1 tablet by mouth daily x 3 days.  (Patient not taking: Reported on 03/18/2020)  . hydrochlorothiazide (HYDRODIURIL) 25 MG tablet Take 1 tablet (25 mg total) by mouth daily.  Marland Kitchen ibuprofen (ADVIL) 800 MG tablet TAKE 1 TABLET BY MOUTH EVERY 8 HOURS AS NEEDED  . LUMIGAN 0.01 % SOLN INSTILL 1 DROP INTO EACH EYE EVERY EVENING  . [DISCONTINUED] amLODipine (NORVASC) 10 MG tablet Take 1 tablet by mouth once daily   No facility-administered encounter medications on file as of 03/18/2020.    Activities of Daily Living In your present state of health, do you have any difficulty performing the following activities: 03/18/2020 03/18/2020  Hearing? N N  Vision? Y N  Comment due glaucoma -  Difficulty concentrating or making decisions? N N  Walking or climbing stairs? Y Y  Comment gets SOB -  Dressing or bathing? N N  Doing errands, shopping? N N  Preparing Food and eating ? N -  Using the Toilet? N -  In the past six months, have you accidently leaked urine? Y -  Do you have problems with loss of bowel control? N -  Managing your Medications? N -  Managing your Finances? N -  Housekeeping or managing your Housekeeping? N -  Some recent data might be hidden    Patient Care Team: Arnette Felts, FNP as PCP - General (General Practice) Chalmers Guest, MD as Consulting Physician (Ophthalmology)   Assessment:   This is a routine wellness examination for Wesley Webb.  Exercise Activities and Dietary recommendations Current Exercise Habits: The patient does not participate in regular exercise at present  Goals    . Exercise 150 min/wk Moderate Activity (pt-stated)     Wants to walk more    . Weight (lb) < 200 lb (90.7 kg)     03/18/2020, want to lose 30 pounds       Fall Risk Fall Risk  03/18/2020 03/18/2020 09/05/2019 05/30/2019 03/14/2019  Falls in the past year? 0 0 0 0 0  Risk for fall due to : Medication side effect - - - Medication side effect  Follow up Falls evaluation completed;Education provided;Falls prevention discussed - - -  Falls prevention discussed;Education provided   Is the patient's home free of loose throw rugs in walkways, pet beds, electrical cords, etc?   yes      Grab bars in the bathroom? no      Handrails on the stairs?   yes      Adequate lighting?   yes  Timed Get Up and Go Performed: n/a  Depression Screen PHQ 2/9 Scores 03/18/2020 03/18/2020 09/05/2019 05/30/2019  PHQ - 2 Score 0 0 0 0  PHQ- 9 Score 0 - - -    Cognitive Function     6CIT Screen 03/18/2020 03/14/2019  What Year? 0 points 0 points  What  month? 0 points 0 points  What time? 0 points 3 points  Count back from 20 0 points 0 points  Months in reverse 4 points 4 points  Repeat phrase 2 points 2 points  Total Score 6 9    Immunization History  Administered Date(s) Administered  . Moderna SARS-COVID-2 Vaccination 02/17/2020, 03/17/2020    Qualifies for Shingles Vaccine? yes  Screening Tests Health Maintenance  Topic Date Due  . Hepatitis C Screening  Never done  . COLONOSCOPY  Never done  . INFLUENZA VACCINE  03/25/2020 (Originally 07/27/2019)  . TETANUS/TDAP  06/21/2021 (Originally 02/26/1966)  . PNA vac Low Risk Adult (1 of 2 - PCV13) 06/21/2021 (Originally 02/27/2012)   Cancer Screenings: Lung: Low Dose CT Chest recommended if Age 67-80 years, 30 pack-year currently smoking OR have quit w/in 15years. Patient does not qualify. Colorectal: cologuard ordered  Additional Screenings:  Hepatitis C Screening:today      Plan:    Patient wants to lose 30 pounds.  I have personally reviewed and noted the following in the patient's chart:   . Medical and social history . Use of alcohol, tobacco or illicit drugs  . Current medications and supplements . Functional ability and status . Nutritional status . Physical activity . Advanced directives . List of other physicians . Hospitalizations, surgeries, and ER visits in previous 12 months . Vitals . Screenings to include cognitive, depression, and falls . Referrals and  appointments  In addition, I have reviewed and discussed with patient certain preventive protocols, quality metrics, and best practice recommendations. A written personalized care plan for preventive services as well as general preventive health recommendations were provided to patient.     Kellie Simmering, LPN  03/16/253

## 2020-03-18 NOTE — Progress Notes (Addendum)
This visit occurred during the SARS-CoV-2 public health emergency.  Safety protocols were in place, including screening questions prior to the visit, additional usage of staff PPE, and extensive cleaning of exam room while observing appropriate contact time as indicated for disinfecting solutions.  Subjective:     Patient ID: Wesley Webb , male    DOB: 02/16/1946 , 74 y.o.   MRN: [id number]   Chief Complaint  Patient presents with  . Annual Exam    HPI  Here for HM   He has had his 2nd vaccine for covid yesterday   Men's preventive visit. Patient Health Questionnaire (PHQ-2) is    Office Visit from 03/18/2020 in [facility]  PHQ-2 Total Score  0     Patient is on a low salt diet. No exercise at this time. Marital status: Married. Relevant history for alcohol use is:  Social History   Substance and Sexual Activity  Alcohol Use No   Relevant history for tobacco use is:  Social History   Tobacco Use  Smoking Status Never Smoker  Smokeless Tobacco Never Used   Past Medical History:  Diagnosis Date  . Brain aneurysm   . Hypertension      Family History  Problem Relation Age of Onset  . Glaucoma Mother   . Hypertension Mother   . Hypertension Father   . Cancer Father   . Alcohol abuse Father   . Kidney disease Brother      Current Outpatient Medications:  .  COMBIGAN 0.2-0.5 % ophthalmic solution, , Disp: , Rfl:  .  diclofenac sodium (VOLTAREN) 1 % GEL, Apply 2 g topically 4 (four) times daily., Disp: 100 g, Rfl: 2 .  hydrochlorothiazide (HYDRODIURIL) 25 MG tablet, Take 1 tablet (25 mg total) by mouth daily., Disp: 90 tablet, Rfl: 1 .  ibuprofen (ADVIL) 800 MG tablet, TAKE 1 TABLET BY MOUTH EVERY 8 HOURS AS NEEDED, Disp: 30 tablet, Rfl: 0 .  LUMIGAN 0.01 % SOLN, INSTILL 1 DROP INTO EACH EYE EVERY EVENING, Disp: , Rfl:  .  furosemide (LASIX) 20 MG tablet, Take 1 tablet by mouth daily x 3 days. (Patient not taking: Reported on 03/18/2020),  Disp: 30 tablet, Rfl: 0   No Known Allergies   Review of Systems  Constitutional: Negative.   HENT: Negative.   Eyes: Negative.   Respiratory: Negative.   Cardiovascular: Negative.   Gastrointestinal: Negative.   Endocrine: Negative.   Genitourinary: Negative.   Musculoskeletal: Negative.   Skin: Negative.   Allergic/Immunologic: Negative.   Neurological: Negative.   Hematological: Negative.   Psychiatric/Behavioral: Negative.      Today's Vitals   03/18/20 1425  BP: 124/82  Pulse: 60  Temp: 98.5 F (36.9 C)  TempSrc: Oral  Weight: 229 lb 9.6 oz (104.1 kg)  Height: 5' 7.2" (1.707 m)  PainSc: 0-No pain   Body mass index is 35.75 kg/m.   Objective:  Physical Exam Vitals reviewed.  Constitutional:      General: He is not in acute distress.    Appearance: Normal appearance. He is obese.  HENT:     Head: Normocephalic and atraumatic.     Right Ear: Tympanic membrane, ear canal and external ear normal. There is no impacted cerumen.     Left Ear: Tympanic membrane, ear canal and external ear normal. There is no impacted cerumen.     Nose:     Comments: Deferred - mask    Mouth/Throat:     Comments: Deferred -  mask  Eyes:     Extraocular Movements: Extraocular movements intact.     Conjunctiva/sclera: Conjunctivae normal.     Pupils: Pupils are equal, round, and reactive to light.  Cardiovascular:     Rate and Rhythm: Normal rate and regular rhythm.     Pulses: Normal pulses.     Heart sounds: Normal heart sounds. No murmur.  Pulmonary:     Effort: Pulmonary effort is normal. No respiratory distress.     Breath sounds: Normal breath sounds.  Abdominal:     General: Abdomen is flat. Bowel sounds are normal. There is no distension.     Palpations: Abdomen is soft.  Genitourinary:    Prostate: Normal.     Rectum: Guaiac result negative.  Musculoskeletal:        General: Normal range of motion.     Cervical back: Normal range of motion and neck supple.      Right lower leg: Edema (trace) present.     Left lower leg: Edema (trace) present.  Skin:    General: Skin is warm.     Capillary Refill: Capillary refill takes less than 2 seconds.  Neurological:     General: No focal deficit present.     Mental Status: He is alert and oriented to person, place, and time.     Cranial Nerves: No cranial nerve deficit.  Psychiatric:        Mood and Affect: Mood normal.        Behavior: Behavior normal.        Thought Content: Thought content normal.        Judgment: Judgment normal.         Assessment And Plan:      1. Health maintenance examination . Behavior modifications discussed and diet history reviewed.   . Pt will continue to exercise regularly and modify diet with low GI, plant based foods and decrease intake of processed foods.  . Recommend intake of daily multivitamin, Vitamin D, and calcium.  . Recommend colonoscopy for preventive screenings, as well as recommend immunizations that include TDAP (on hold due to recently getting covid vaccine)  2. Essential hypertension . B/P is in excellent control.  . CMP ordered to check renal function.  . The importance of regular exercise and dietary modification was stressed to the patient. . . EKG done with atrial bradycardia and first degree av block HR 54 - POCT Urinalysis Dipstick (81002) - POCT UA - Microalbumin - EKG 12-Lead  3. Edema, unspecified type  Improved after taking furosemide  Encouraged to wear support socks  4. Prediabetes  Chronic, slightly elevated at last visit  5. Mixed hyperlipidemia  Chronic, controlled  No current medications  Diet controlled - Lipid panel  6. Encounter for hepatitis C screening test for low risk patient  Will check for Hepatitis C screening due to being born between the years 1945-1965 - Hepatitis C antibody      Arnette Felts, FNP    THE PATIENT IS ENCOURAGED TO PRACTICE SOCIAL DISTANCING DUE TO THE COVID-19 PANDEMIC.

## 2020-03-18 NOTE — Patient Instructions (Signed)
Wesley Webb , Thank you for taking time to come for your Medicare Wellness Visit. I appreciate your ongoing commitment to your health goals. Please review the following plan we discussed and let me know if I can assist you in the future.   Screening recommendations/referrals: Colonoscopy: cologuard ordered Recommended yearly ophthalmology/optometry visit for glaucoma screening and checkup Recommended yearly dental visit for hygiene and checkup  Vaccinations: Influenza vaccine: postponed due to covid vaccine Pneumococcal vaccine: postponed due to covid vaccine Tdap vaccine: postponed due to covid vaccine Shingles vaccine: discussed    Advanced directives: Please bring a copy of your POA (Power of Attorney) and/or Living Will to your next appointment.    Conditions/risks identified: obesity  Next appointment: 06/23/2020 at 3:00  Preventive Care 65 Years and Older, Male Preventive care refers to lifestyle choices and visits with your health care provider that can promote health and wellness. What does preventive care include?  A yearly physical exam. This is also called an annual well check.  Dental exams once or twice a year.  Routine eye exams. Ask your health care provider how often you should have your eyes checked.  Personal lifestyle choices, including:  Daily care of your teeth and gums.  Regular physical activity.  Eating a healthy diet.  Avoiding tobacco and drug use.  Limiting alcohol use.  Practicing safe sex.  Taking low doses of aspirin every day.  Taking vitamin and mineral supplements as recommended by your health care provider. What happens during an annual well check? The services and screenings done by your health care provider during your annual well check will depend on your age, overall health, lifestyle risk factors, and family history of disease. Counseling  Your health care provider may ask you questions about your:  Alcohol use.  Tobacco  use.  Drug use.  Emotional well-being.  Home and relationship well-being.  Sexual activity.  Eating habits.  History of falls.  Memory and ability to understand (cognition).  Work and work Astronomer. Screening  You may have the following tests or measurements:  Height, weight, and BMI.  Blood pressure.  Lipid and cholesterol levels. These may be checked every 5 years, or more frequently if you are over 59 years old.  Skin check.  Lung cancer screening. You may have this screening every year starting at age 58 if you have a 30-pack-year history of smoking and currently smoke or have quit within the past 15 years.  Fecal occult blood test (FOBT) of the stool. You may have this test every year starting at age 35.  Flexible sigmoidoscopy or colonoscopy. You may have a sigmoidoscopy every 5 years or a colonoscopy every 10 years starting at age 71.  Prostate cancer screening. Recommendations will vary depending on your family history and other risks.  Hepatitis C blood test.  Hepatitis B blood test.  Sexually transmitted disease (STD) testing.  Diabetes screening. This is done by checking your blood sugar (glucose) after you have not eaten for a while (fasting). You may have this done every 1-3 years.  Abdominal aortic aneurysm (AAA) screening. You may need this if you are a current or former smoker.  Osteoporosis. You may be screened starting at age 37 if you are at high risk. Talk with your health care provider about your test results, treatment options, and if necessary, the need for more tests. Vaccines  Your health care provider may recommend certain vaccines, such as:  Influenza vaccine. This is recommended every year.  Tetanus, diphtheria,  and acellular pertussis (Tdap, Td) vaccine. You may need a Td booster every 10 years.  Zoster vaccine. You may need this after age 81.  Pneumococcal 13-valent conjugate (PCV13) vaccine. One dose is recommended after age  29.  Pneumococcal polysaccharide (PPSV23) vaccine. One dose is recommended after age 35. Talk to your health care provider about which screenings and vaccines you need and how often you need them. This information is not intended to replace advice given to you by your health care provider. Make sure you discuss any questions you have with your health care provider. Document Released: 01/08/2016 Document Revised: 08/31/2016 Document Reviewed: 10/13/2015 Elsevier Interactive Patient Education  2017 [employer] Prevention in the Home Falls can cause injuries. They can happen to people of all ages. There are many things you can do to make your home safe and to help prevent falls. What can I do on the outside of my home?  Regularly fix the edges of walkways and driveways and fix any cracks.  Remove anything that might make you trip as you walk through a door, such as a raised step or threshold.  Trim any bushes or trees on the path to your home.  Use bright outdoor lighting.  Clear any walking paths of anything that might make someone trip, such as rocks or tools.  Regularly check to see if handrails are loose or broken. Make sure that both sides of any steps have handrails.  Any raised decks and porches should have guardrails on the edges.  Have any leaves, snow, or ice cleared regularly.  Use sand or salt on walking paths during winter.  Clean up any spills in your garage right away. This includes oil or grease spills. What can I do in the bathroom?  Use night lights.  Install grab bars by the toilet and in the tub and shower. Do not use towel bars as grab bars.  Use non-skid mats or decals in the tub or shower.  If you need to sit down in the shower, use a plastic, non-slip stool.  Keep the floor dry. Clean up any water that spills on the floor as soon as it happens.  Remove soap buildup in the tub or shower regularly.  Attach bath mats securely with double-sided  non-slip rug tape.  Do not have throw rugs and other things on the floor that can make you trip. What can I do in the bedroom?  Use night lights.  Make sure that you have a light by your bed that is easy to reach.  Do not use any sheets or blankets that are too big for your bed. They should not hang down onto the floor.  Have a firm chair that has side arms. You can use this for support while you get dressed.  Do not have throw rugs and other things on the floor that can make you trip. What can I do in the kitchen?  Clean up any spills right away.  Avoid walking on wet floors.  Keep items that you use a lot in easy-to-reach places.  If you need to reach something above you, use a strong step stool that has a grab bar.  Keep electrical cords out of the way.  Do not use floor polish or wax that makes floors slippery. If you must use wax, use non-skid floor wax.  Do not have throw rugs and other things on the floor that can make you trip. What can I do with my  stairs?  Do not leave any items on the stairs.  Make sure that there are handrails on both sides of the stairs and use them. Fix handrails that are broken or loose. Make sure that handrails are as long as the stairways.  Check any carpeting to make sure that it is firmly attached to the stairs. Fix any carpet that is loose or worn.  Avoid having throw rugs at the top or bottom of the stairs. If you do have throw rugs, attach them to the floor with carpet tape.  Make sure that you have a light switch at the top of the stairs and the bottom of the stairs. If you do not have them, ask someone to add them for you. What else can I do to help prevent falls?  Wear shoes that:  Do not have high heels.  Have rubber bottoms.  Are comfortable and fit you well.  Are closed at the toe. Do not wear sandals.  If you use a stepladder:  Make sure that it is fully opened. Do not climb a closed stepladder.  Make sure that both  sides of the stepladder are locked into place.  Ask someone to hold it for you, if possible.  Clearly mark and make sure that you can see:  Any grab bars or handrails.  First and last steps.  Where the edge of each step is.  Use tools that help you move around (mobility aids) if they are needed. These include:  Canes.  Walkers.  Scooters.  Crutches.  Turn on the lights when you go into a dark area. Replace any light bulbs as soon as they burn out.  Set up your furniture so you have a clear path. Avoid moving your furniture around.  If any of your floors are uneven, fix them.  If there are any pets around you, be aware of where they are.  Review your medicines with your doctor. Some medicines can make you feel dizzy. This can increase your chance of falling. Ask your doctor what other things that you can do to help prevent falls. This information is not intended to replace advice given to you by your health care provider. Make sure you discuss any questions you have with your health care provider. Document Released: 10/08/2009 Document Revised: 05/19/2016 Document Reviewed: 01/16/2015 Elsevier Interactive Patient Education  2017 [employer].

## 2020-03-18 NOTE — Patient Instructions (Signed)
Health Maintenance After Age 73 After age 73, you are at a higher risk for certain long-term diseases and infections as well as injuries from falls. Falls are a major cause of broken bones and head injuries in people who are older than age 73. Getting regular preventive care can help to keep you healthy and well. Preventive care includes getting regular testing and making lifestyle changes as recommended by your health care provider. Talk with your health care provider about:  Which screenings and tests you should have. A screening is a test that checks for a disease when you have no symptoms.  A diet and exercise plan that is right for you. What should I know about screenings and tests to prevent falls? Screening and testing are the best ways to find a health problem early. Early diagnosis and treatment give you the best chance of managing medical conditions that are common after age 73. Certain conditions and lifestyle choices may make you more likely to have a fall. Your health care provider may recommend:  Regular vision checks. Poor vision and conditions such as cataracts can make you more likely to have a fall. If you wear glasses, make sure to get your prescription updated if your vision changes.  Medicine review. Work with your health care provider to regularly review all of the medicines you are taking, including over-the-counter medicines. Ask your health care provider about any side effects that may make you more likely to have a fall. Tell your health care provider if any medicines that you take make you feel dizzy or sleepy.  Osteoporosis screening. Osteoporosis is a condition that causes the bones to get weaker. This can make the bones weak and cause them to break more easily.  Blood pressure screening. Blood pressure changes and medicines to control blood pressure can make you feel dizzy.  Strength and balance checks. Your health care provider may recommend certain tests to check your  strength and balance while standing, walking, or changing positions.  Foot health exam. Foot pain and numbness, as well as not wearing proper footwear, can make you more likely to have a fall.  Depression screening. You may be more likely to have a fall if you have a fear of falling, feel emotionally low, or feel unable to do activities that you used to do.  Alcohol use screening. Using too much alcohol can affect your balance and may make you more likely to have a fall. What actions can I take to lower my risk of falls? General instructions  Talk with your health care provider about your risks for falling. Tell your health care provider if: ? You fall. Be sure to tell your health care provider about all falls, even ones that seem minor. ? You feel dizzy, sleepy, or off-balance.  Take over-the-counter and prescription medicines only as told by your health care provider. These include any supplements.  Eat a healthy diet and maintain a healthy weight. A healthy diet includes low-fat dairy products, low-fat (lean) meats, and fiber from whole grains, beans, and lots of fruits and vegetables. Home safety  Remove any tripping hazards, such as rugs, cords, and clutter.  Install safety equipment such as grab bars in bathrooms and safety rails on stairs.  Keep rooms and walkways well-lit. Activity   Follow a regular exercise program to stay fit. This will help you maintain your balance. Ask your health care provider what types of exercise are appropriate for you.  If you need a cane or   walker, use it as recommended by your health care provider.  Wear supportive shoes that have nonskid soles. Lifestyle  Do not drink alcohol if your health care provider tells you not to drink.  If you drink alcohol, limit how much you have: ? 0-1 drink a day for women. ? 0-2 drinks a day for men.  Be aware of how much alcohol is in your drink. In the [place] one drink equals one typical bottle of beer (12  oz), one-half glass of wine (5 oz), or one shot of hard liquor (1 oz).  Do not use any products that contain nicotine or tobacco, such as cigarettes and e-cigarettes. If you need help quitting, ask your health care provider. Summary  Having a healthy lifestyle and getting preventive care can help to protect your health and wellness after age 73.  Screening and testing are the best way to find a health problem early and help you avoid having a fall. Early diagnosis and treatment give you the best chance for managing medical conditions that are more common for people who are older than age 73.  Falls are a major cause of broken bones and head injuries in people who are older than age 73. Take precautions to prevent a fall at home.  Work with your health care provider to learn what changes you can make to improve your health and wellness and to prevent falls. This information is not intended to replace advice given to you by your health care provider. Make sure you discuss any questions you have with your health care provider. Document Revised: 04/04/2019 Document Reviewed: 10/25/2017 Elsevier Patient Education  2020 [employer]  

## 2020-03-19 ENCOUNTER — Encounter: Payer: Medicare Other | Admitting: Nurse Practitioner

## 2020-03-19 ENCOUNTER — Ambulatory Visit: Payer: Medicare Other

## 2020-03-19 DIAGNOSIS — R319 Hematuria, unspecified: Secondary | ICD-10-CM | POA: Diagnosis not present

## 2020-03-19 DIAGNOSIS — N390 Urinary tract infection, site not specified: Secondary | ICD-10-CM | POA: Diagnosis not present

## 2020-03-19 LAB — POCT URINALYSIS DIPSTICK
Bilirubin, UA: NEGATIVE
Color, UA: POSITIVE
Glucose, UA: POSITIVE — AB
Ketones, UA: NEGATIVE
Nitrite, UA: POSITIVE
Protein, UA: NEGATIVE
Spec Grav, UA: 1.02 (ref 1.010–1.025)
Urobilinogen, UA: 1 E.U./dL
pH, UA: 6.5 (ref 5.0–8.0)

## 2020-03-19 LAB — POCT UA - MICROALBUMIN
Albumin/Creatinine Ratio, Urine, POC: <30
Creatinine, POC: 200 mg/dL
Microalbumin Ur, POC: 10 mg/L

## 2020-03-19 MED ORDER — CIPROFLOXACIN HCL 500 MG PO TABS: 500.0000 mg | ORAL_TABLET | Freq: Two times a day (BID) | ORAL | 0 refills | Status: AC

## 2020-03-23 LAB — URINE CULTURE

## 2020-03-24 ENCOUNTER — Other Ambulatory Visit: Payer: Self-pay | Admitting: Nurse Practitioner

## 2020-03-24 DIAGNOSIS — R109 Unspecified abdominal pain: Secondary | ICD-10-CM

## 2020-03-25 NOTE — Telephone Encounter (Signed)
Is it ok for him to have this refill of Ibuprofen 800?

## 2020-03-25 NOTE — Telephone Encounter (Signed)
Yes, it is okay the last refill was 6 months ago

## 2020-03-26 ENCOUNTER — Other Ambulatory Visit: Payer: Self-pay

## 2020-03-26 DIAGNOSIS — R319 Hematuria, unspecified: Secondary | ICD-10-CM

## 2020-03-26 DIAGNOSIS — N390 Urinary tract infection, site not specified: Secondary | ICD-10-CM

## 2020-03-31 ENCOUNTER — Ambulatory Visit: Payer: Medicare Other

## 2020-04-02 ENCOUNTER — Other Ambulatory Visit: Payer: Medicare Other

## 2020-04-02 ENCOUNTER — Other Ambulatory Visit (INDEPENDENT_AMBULATORY_CARE_PROVIDER_SITE_OTHER): Payer: Medicare Other

## 2020-04-02 ENCOUNTER — Other Ambulatory Visit: Payer: Self-pay

## 2020-04-02 DIAGNOSIS — N390 Urinary tract infection, site not specified: Secondary | ICD-10-CM

## 2020-04-02 DIAGNOSIS — R319 Hematuria, unspecified: Secondary | ICD-10-CM

## 2020-04-02 LAB — POCT URINALYSIS DIPSTICK
Bilirubin, UA: NEGATIVE
Glucose, UA: NEGATIVE
Ketones, UA: NEGATIVE
Nitrite, UA: NEGATIVE
Protein, UA: NEGATIVE
Spec Grav, UA: 1.02 (ref 1.010–1.025)
Urobilinogen, UA: 0.2 E.U./dL
pH, UA: 6 (ref 5.0–8.0)

## 2020-04-03 LAB — URINE CULTURE

## 2020-04-06 ENCOUNTER — Other Ambulatory Visit: Payer: Self-pay | Admitting: Nurse Practitioner

## 2020-04-06 DIAGNOSIS — I10 Essential (primary) hypertension: Secondary | ICD-10-CM

## 2020-04-06 DIAGNOSIS — R609 Edema, unspecified: Secondary | ICD-10-CM

## 2020-06-03 DIAGNOSIS — H401133 Primary open-angle glaucoma, bilateral, severe stage: Secondary | ICD-10-CM | POA: Diagnosis not present

## 2020-06-23 ENCOUNTER — Encounter: Payer: Medicare Other | Admitting: Nurse Practitioner

## 2020-09-01 ENCOUNTER — Ambulatory Visit: Payer: Self-pay

## 2020-09-01 ENCOUNTER — Ambulatory Visit: Payer: Medicare Other | Attending: Internal Medicine

## 2020-09-01 DIAGNOSIS — Z23 Encounter for immunization: Secondary | ICD-10-CM

## 2020-09-01 NOTE — Progress Notes (Signed)
   Cov[facility] Vaccination [facility]  Name:  Wesley Webb    MRN: [id number] DOB: 15-Dec-1946  09/01/2020  Wesley Webb was observed post Covid-19 immunization for 15 minutes without incident. He was provided with Vaccine Information Sheet and instruction to access the V-Safe system.   Wesley Webb was instructed to call 911 with any severe reactions post vaccine: Marland Kitchen Difficulty breathing  . Swelling of face and throat  . A fast heartbeat  . A bad rash all over body  . Dizziness and weakness

## 2020-09-16 DIAGNOSIS — Z20822 Contact with and (suspected) exposure to covid-19: Secondary | ICD-10-CM | POA: Diagnosis not present

## 2020-09-23 ENCOUNTER — Other Ambulatory Visit: Payer: Self-pay

## 2020-09-23 ENCOUNTER — Encounter: Payer: Self-pay | Admitting: Nurse Practitioner

## 2020-09-23 ENCOUNTER — Ambulatory Visit (INDEPENDENT_AMBULATORY_CARE_PROVIDER_SITE_OTHER): Payer: Medicare Other | Admitting: Nurse Practitioner

## 2020-09-23 VITALS — BP 136/78 | HR 82 | Temp 98.1°F | Ht 67.2 in | Wt 221.4 lb

## 2020-09-23 DIAGNOSIS — E782 Mixed hyperlipidemia: Secondary | ICD-10-CM

## 2020-09-23 DIAGNOSIS — Z2821 Immunization not carried out because of patient refusal: Secondary | ICD-10-CM | POA: Diagnosis not present

## 2020-09-23 DIAGNOSIS — I10 Essential (primary) hypertension: Secondary | ICD-10-CM

## 2020-09-23 DIAGNOSIS — R7309 Other abnormal glucose: Secondary | ICD-10-CM | POA: Diagnosis not present

## 2020-09-23 DIAGNOSIS — E6609 Other obesity due to excess calories: Secondary | ICD-10-CM

## 2020-09-23 DIAGNOSIS — Z1211 Encounter for screening for malignant neoplasm of colon: Secondary | ICD-10-CM

## 2020-09-23 DIAGNOSIS — Z1159 Encounter for screening for other viral diseases: Secondary | ICD-10-CM | POA: Diagnosis not present

## 2020-09-23 DIAGNOSIS — Z6834 Body mass index (BMI) 34.0-34.9, adult: Secondary | ICD-10-CM

## 2020-09-23 NOTE — Progress Notes (Signed)
I,Tianna Badgett,acting as a Neurosurgeon for [employer], FNP.,have documented all relevant documentation on the behalf of Arnette Felts, FNP,as directed by  Arnette Felts, FNP while in the presence of Arnette Felts, FNP.  This visit occurred during the SARS-CoV-2 public health emergency.  Safety protocols were in place, including screening questions prior to the visit, additional usage of staff PPE, and extensive cleaning of exam room while observing appropriate contact time as indicated for disinfecting solutions.  Subjective:     Patient ID: Wesley Webb , male    DOB: 21-May-1946 , 74 y.o.   MRN: [id number]   Chief Complaint  Patient presents with  . Hypertension    HPI  Pt is here for HTN FU. He reports compliance with medication.  He has no complaints at this time.   His brother passed away and he has been eating more cake in the last week.   He has had his 3rd Covid vaccine [employer]) earlier this month and reports did well.   Wt Readings from Last 3 Encounters: 09/23/20 : 221 lb 6.4 oz (100.4 kg) 03/18/20 : 229 lb (103.9 kg) 03/18/20 : 229 lb 9.6 oz (104.1 kg)     Past Medical History:  Diagnosis Date  . Brain aneurysm   . Hypertension      Family History  Problem Relation Age of Onset  . Glaucoma Mother   . Hypertension Mother   . Hypertension Father   . Cancer Father   . Alcohol abuse Father   . Kidney disease Brother      Current Outpatient Medications:  .  COMBIGAN 0.2-0.5 % ophthalmic solution, , Disp: , Rfl:  .  diclofenac sodium (VOLTAREN) 1 % GEL, Apply 2 g topically 4 (four) times daily., Disp: 100 g, Rfl: 2 .  hydrochlorothiazide (HYDRODIURIL) 25 MG tablet, Take 1 tablet by mouth once daily, Disp: 90 tablet, Rfl: 0 .  ibuprofen (ADVIL) 800 MG tablet, TAKE 1 TABLET BY MOUTH EVERY 8 HOURS AS NEEDED, Disp: 30 tablet, Rfl: 0 .  LUMIGAN 0.01 % SOLN, INSTILL 1 DROP INTO EACH EYE EVERY EVENING, Disp: , Rfl:    No Known Allergies   Review of Systems   Constitutional: Negative.   Respiratory: Negative.   Cardiovascular: Negative.   Gastrointestinal: Negative.   Endocrine: Negative for polydipsia, polyphagia and polyuria.  Neurological: Negative.   Psychiatric/Behavioral: Negative.      Today's Vitals   09/23/20 1414  BP: 136/78  Pulse: 82  Temp: 98.1 F (36.7 C)  TempSrc: Oral  Weight: 221 lb 6.4 oz (100.4 kg)  Height: 5' 7.2" (1.707 m)   Body mass index is 34.47 kg/m.   Objective:  Physical Exam      Assessment And Plan:     1. Essential hypertension . B/P is well controlled.  . CMP ordered to check renal function.  . The importance of regular exercise and dietary modification was stressed to the patient.   2. Mixed hyperlipidemia  Chronic, controlled  Continue with current medications  3. Encounter for hepatitis C screening test for low risk patient  Will check Hepatitis C screening due to recent recommendations to screen all adults 18 years and older  4. Abnormal glucose  Chronic, increased HgbA1c at last visit  no current medications  Encouraged to limit intake of sugary foods and drinks  Encouraged to increase physical activity to 150 minutes per week as tolerated   His HgbA1c was 6.9 at last visit if continues to be elevated greater  than 7 will consider medications  5. Influenza vaccination declined by patient  Explained risk of not taking influenza vaccine. Encouraged to take vitamin c and zinc to help boost immune system  6. Class 1 obesity due to excess calories with serious comorbidity and body mass index (BMI) of 34.0 to 34.9 in adult  He has lost 9 lbs since his last visit  He is encouraged to initially strive for BMI less than 30 to decrease cardiac risk. He is advised to exercise no less than 150 minutes per week.  Wt Readings from Last 3 Encounters:  09/23/20 221 lb 6.4 oz (100.4 kg)  03/18/20 229 lb (103.9 kg)  03/18/20 229 lb 9.6 oz (104.1 kg)       Patient was given  opportunity to ask questions. Patient verbalized understanding of the plan and was able to repeat key elements of the plan. All questions were answered to their satisfaction.     Jeanell Sparrow, FNP, have reviewed all documentation for this visit. The documentation on 09/23/20 for the exam, diagnosis, procedures, and orders are all accurate and complete.  THE PATIENT IS ENCOURAGED TO PRACTICE SOCIAL DISTANCING DUE TO THE COVID-19 PANDEMIC.

## 2020-09-23 NOTE — Patient Instructions (Addendum)
Hypertension, Adult High blood pressure (hypertension) is when the force of blood pumping through the arteries is too strong. The arteries are the blood vessels that carry blood from the heart throughout the body. Hypertension forces the heart to work harder to pump blood and may cause arteries to become narrow or stiff. Untreated or uncontrolled hypertension can cause a heart attack, heart failure, a stroke, kidney disease, and other problems. A blood pressure reading consists of a higher number over a lower number. Ideally, your blood pressure should be below 120/80. The first ("top") number is called the systolic pressure. It is a measure of the pressure in your arteries as your heart beats. The second ("bottom") number is called the diastolic pressure. It is a measure of the pressure in your arteries as the heart relaxes. What are the causes? The exact cause of this condition is not known. There are some conditions that result in or are related to high blood pressure. What increases the risk? Some risk factors for high blood pressure are under your control. The following factors may make you more likely to develop this condition:  Smoking.  Having type 2 diabetes mellitus, high cholesterol, or both.  Not getting enough exercise or physical activity.  Being overweight.  Having too much fat, sugar, calories, or salt (sodium) in your diet.  Drinking too much alcohol. Some risk factors for high blood pressure may be difficult or impossible to change. Some of these factors include:  Having chronic kidney disease.  Having a family history of high blood pressure.  Age. Risk increases with age.  Race. You may be at higher risk if you are African American.  Gender. Men are at higher risk than women before age 45. After age 65, women are at higher risk than men.  Having obstructive sleep apnea.  Stress. What are the signs or symptoms? High blood pressure may not cause symptoms. Very high  blood pressure (hypertensive crisis) may cause:  Headache.  Anxiety.  Shortness of breath.  Nosebleed.  Nausea and vomiting.  Vision changes.  Severe chest pain.  Seizures. How is this diagnosed? This condition is diagnosed by measuring your blood pressure while you are seated, with your arm resting on a flat surface, your legs uncrossed, and your feet flat on the floor. The cuff of the blood pressure monitor will be placed directly against the skin of your upper arm at the level of your heart. It should be measured at least twice using the same arm. Certain conditions can cause a difference in blood pressure between your right and left arms. Certain factors can cause blood pressure readings to be lower or higher than normal for a short period of time:  When your blood pressure is higher when you are in a health care provider's office than when you are at home, this is called white coat hypertension. Most people with this condition do not need medicines.  When your blood pressure is higher at home than when you are in a health care provider's office, this is called masked hypertension. Most people with this condition may need medicines to control blood pressure. If you have a high blood pressure reading during one visit or you have normal blood pressure with other risk factors, you may be asked to:  Return on a different day to have your blood pressure checked again.  Monitor your blood pressure at home for 1 week or longer. If you are diagnosed with hypertension, you may have other blood or   imaging tests to help your health care provider understand your overall risk for other conditions. How is this treated? This condition is treated by making healthy lifestyle changes, such as eating healthy foods, exercising more, and reducing your alcohol intake. Your health care provider may prescribe medicine if lifestyle changes are not enough to get your blood pressure under control, and  if:  Your systolic blood pressure is above 130.  Your diastolic blood pressure is above 80. Your personal target blood pressure may vary depending on your medical conditions, your age, and other factors. Follow these instructions at home: Eating and drinking   Eat a diet that is high in fiber and potassium, and low in sodium, added sugar, and fat. An example eating plan is called the DASH (Dietary Approaches to Stop Hypertension) diet. To eat this way: ? Eat plenty of fresh fruits and vegetables. Try to fill one half of your plate at each meal with fruits and vegetables. ? Eat whole grains, such as whole-wheat pasta, brown rice, or whole-grain bread. Fill about one fourth of your plate with whole grains. ? Eat or drink low-fat dairy products, such as skim milk or low-fat yogurt. ? Avoid fatty cuts of meat, processed or cured meats, and poultry with skin. Fill about one fourth of your plate with lean proteins, such as fish, chicken without skin, beans, eggs, or tofu. ? Avoid pre-made and processed foods. These tend to be higher in sodium, added sugar, and fat.  Reduce your daily sodium intake. Most people with hypertension should eat less than 1,500 mg of sodium a day.  Do not drink alcohol if: ? Your health care provider tells you not to drink. ? You are pregnant, may be pregnant, or are planning to become pregnant.  If you drink alcohol: ? Limit how much you use to:  0-1 drink a day for women.  0-2 drinks a day for men. ? Be aware of how much alcohol is in your drink. In the [place] one drink equals one 12 oz bottle of beer (355 mL), one 5 oz glass of wine (148 mL), or one 1 oz glass of hard liquor (44 mL). Lifestyle   Work with your health care provider to maintain a healthy body weight or to lose weight. Ask what an ideal weight is for you.  Get at least 30 minutes of exercise most days of the week. Activities may include walking, swimming, or biking.  Include exercise to  strengthen your muscles (resistance exercise), such as Pilates or lifting weights, as part of your weekly exercise routine. Try to do these types of exercises for 30 minutes at least 3 days a week.  Do not use any products that contain nicotine or tobacco, such as cigarettes, e-cigarettes, and chewing tobacco. If you need help quitting, ask your health care provider.  Monitor your blood pressure at home as told by your health care provider.  Keep all follow-up visits as told by your health care provider. This is important. Medicines  Take over-the-counter and prescription medicines only as told by your health care provider. Follow directions carefully. Blood pressure medicines must be taken as prescribed.  Do not skip doses of blood pressure medicine. Doing this puts you at risk for problems and can make the medicine less effective.  Ask your health care provider about side effects or reactions to medicines that you should watch for. Contact a health care provider if you:  Think you are having a reaction to a medicine you   are taking.  Have headaches that keep coming back (recurring).  Feel dizzy.  Have swelling in your ankles.  Have trouble with your vision. Get help right away if you:  Develop a severe headache or confusion.  Have unusual weakness or numbness.  Feel faint.  Have severe pain in your chest or abdomen.  Vomit repeatedly.  Have trouble breathing. Summary  Hypertension is when the force of blood pumping through your arteries is too strong. If this condition is not controlled, it may put you at risk for serious complications.  Your personal target blood pressure may vary depending on your medical conditions, your age, and other factors. For most people, a normal blood pressure is less than 120/80.  Hypertension is treated with lifestyle changes, medicines, or a combination of both. Lifestyle changes include losing weight, eating a healthy, low-sodium diet,  exercising more, and limiting alcohol. This information is not intended to replace advice given to you by your health care provider. Make sure you discuss any questions you have with your health care provider. Document Revised: 08/22/2018 Document Reviewed: 08/22/2018 Elsevier Patient Education  2020 [employer] Inc[employer]   Prediabetes Eating Plan Prediabetes is a condition that causes blood sugar (glucose) levels to be higher than normal. This increases the risk for developing diabetes. In order to prevent diabetes from developing, your health care provider may recommend a diet and other lifestyle changes to help you:  Control your blood glucose levels.  Improve your cholesterol levels.  Manage your blood pressure. Your health care provider may recommend working with a diet and nutrition specialist (dietitian) to make a meal plan that is best for you. What are tips for following this plan? Lifestyle  Set weight loss goals with the help of your health care team. It is recommended that most people with prediabetes lose 7% of their current body weight.  Exercise for at least 30 minutes at least 5 days a week.  Attend a support group or seek ongoing support from a mental health counselor.  Take over-the-counter and prescription medicines only as told by your health care provider. Reading food labels  Read food labels to check the amount of fat, salt (sodium), and sugar in prepackaged foods. Avoid foods that have: ? Saturated fats. ? Trans fats. ? Added sugars.  Avoid foods that have more than 300 milligrams (mg) of sodium per serving. Limit your daily sodium intake to less than 2,300 mg each day. Shopping  Avoid buying pre-made and processed foods. Cooking  Cook with olive oil. Do not use butter, lard, or ghee.  Bake, broil, grill, or boil foods. Avoid frying. Meal planning   Work with your dietitian to develop an eating plan that is right for you. This may include: ? Tracking how  many calories you take in. Use a food diary, notebook, or mobile application to track what you eat at each meal. ? Using the glycemic index (GI) to plan your meals. The index tells you how quickly a food will raise your blood glucose. Choose low-GI foods. These foods take a longer time to raise blood glucose.  Consider following a Mediterranean diet. This diet includes: ? Several servings each day of fresh fruits and vegetables. ? Eating fish at least twice a week. ? Several servings each day of whole grains, beans, nuts, and seeds. ? Using olive oil instead of other fats. ? Moderate alcohol consumption. ? Eating small amounts of red meat and whole-fat dairy.  If you have high blood pressure, you  may need to limit your sodium intake or follow a diet such as the DASH eating plan. DASH is an eating plan that aims to lower high blood pressure. What foods are recommended? The items listed below may not be a complete list. Talk with your dietitian about what dietary choices are best for you. Grains Whole grains, such as whole-wheat or whole-grain breads, crackers, cereals, and pasta. Unsweetened oatmeal. Bulgur. Barley. Quinoa. Brown rice. Corn or whole-wheat flour tortillas or taco shells. Vegetables Lettuce. Spinach. Peas. Beets. Cauliflower. Cabbage. Broccoli. Carrots. Tomatoes. Squash. Eggplant. Herbs. Peppers. Onions. Cucumbers. Brussels sprouts. Fruits Berries. Bananas. Apples. Oranges. Grapes. Papaya. Mango. Pomegranate. Kiwi. Grapefruit. Cherries. Meats and other protein foods Seafood. Poultry without skin. Lean cuts of pork and beef. Tofu. Eggs. Nuts. Beans. Dairy Low-fat or fat-free dairy products, such as yogurt, cottage cheese, and cheese. Beverages Water. Tea. Coffee. Sugar-free or diet soda. Seltzer water. Lowfat or no-fat milk. Milk alternatives, such as soy or almond milk. Fats and oils Olive oil. Canola oil. Sunflower oil. Grapeseed oil. Avocado. Walnuts. Sweets and  desserts Sugar-free or low-fat pudding. Sugar-free or low-fat ice cream and other frozen treats. Seasoning and other foods Herbs. Sodium-free spices. Mustard. Relish. Low-fat, low-sugar ketchup. Low-fat, low-sugar barbecue sauce. Low-fat or fat-free mayonnaise. What foods are not recommended? The items listed below may not be a complete list. Talk with your dietitian about what dietary choices are best for you. Grains Refined white flour and flour products, such as bread, pasta, snack foods, and cereals. Vegetables Canned vegetables. Frozen vegetables with butter or cream sauce. Fruits Fruits canned with syrup. Meats and other protein foods Fatty cuts of meat. Poultry with skin. Breaded or fried meat. Processed meats. Dairy Full-fat yogurt, cheese, or milk. Beverages Sweetened drinks, such as sweet iced tea and soda. Fats and oils Butter. Lard. Ghee. Sweets and desserts Baked goods, such as cake, cupcakes, pastries, cookies, and cheesecake. Seasoning and other foods Spice mixes with added salt. Ketchup. Barbecue sauce. Mayonnaise. Summary  To prevent diabetes from developing, you may need to make diet and other lifestyle changes to help control blood sugar, improve cholesterol levels, and manage your blood pressure.  Set weight loss goals with the help of your health care team. It is recommended that most people with prediabetes lose 7 percent of their current body weight.  Consider following a Mediterranean diet that includes plenty of fresh fruits and vegetables, whole grains, beans, nuts, seeds, fish, lean meat, low-fat dairy, and healthy oils. This information is not intended to replace advice given to you by your health care provider. Make sure you discuss any questions you have with your health care provider. Document Revised: 04/05/2019 Document Reviewed: 02/15/2017 Elsevier Patient Education  2020 [employer] Inc.   Hepatitis C Testing Why am I having this test? Hepatitis  C testing is done to check for a liver infection caused by the hepatitis C virus (HCV). You may have one or more hepatitis C tests done:  To help your health care provider diagnose HCV infection, if you have possible signs or symptoms of infection.  To check for infection, if you may have been exposed to HCV.  To find the cause of long-term (chronic) liver disease or abnormal liver function test results.  To see if you have had hepatitis C in the past. Hepatitis C is usually diagnosed with three blood tests:  Anti-HCV test, also called the HCV antibody test.  HCV RNA test.  HCV genotype test. If you are diagnosed with a  current (active) HCV infection, you may have another test done to help monitor your condition during treatment. This test is called the quantitative HCV RNA test. What is being tested? Each HCV test measures the amounts of different substances in your blood.  The anti-HCV test checks for proteins that your body makes to fight HCV (antibodies). If you have antibodies to HCV, it means you have been infected with hepatitis C. It does not necessarily mean that you have an active infection.  The HCV RNA test checks for genetic material from HCV. This test is done if your HCV antibody test is positive and your health care provider wants to find out if you have an active infection.  The HCV genotype test. This test identifies the type (genotype) of virus you have.  The quantitative HCV RNA test measures the amount of virus in your blood (viral load). What kind of sample is taken?  A blood sample is required for HCV tests. It is usually collected by inserting a needle into a blood vessel. How are the results reported?  Anti-HCV test results are reported as either positive or negative for HCV antibodies.  HCV RNA test results are reported as either positive or negative for HCV genetic material.  HCV genotype test results are reported as which genotype of the virus you have.  Genotypes are numbered 1 through 6.  Quantitative HCV RNA test results are reported as a number that indicates your viral load. This is given as international units of virus per milliliter of blood (IU/mL). ? A result of 800,000 IU/mL or greater is considered a high viral load. ? A result of less than 800,000 IU/mL is considered a low viral load. Sometimes, results from the anti-HCV test or the HCV RNA test may report that:  HCV antibodies or genetic material are present when they are not present (false-positive result).  HCV antibodies or genetic material are not present when they are present (false-negative result). What do the results mean? For the anti-HCV test:  A negative result may mean that you have not been infected with HCV. You may need to have this test done again to confirm this result.  A positive result may mean that you have an active HCV infection, or that you have been infected with HCV in the past. An HCV infection may not cause any symptoms, and your body may get rid of the virus without treatment. For the HCV RNA test:  A negative result means that you do not have an active HCV infection.  A positive result means that you have an active HCV infection. For the HCV genotype test, knowing the specific genotype you have will help your health care provider recommend the treatment that will work best for you. The quantitative HCV RNA test gives your health care provider an idea of how well your treatment is working.  If your viral load is high, you may need different treatment.  If your viral load is low, your treatment may be working effectively.  You may have this test repeated to continue to monitor your treatment. Talk with your health care provider about what your results mean. Questions to ask your health care provider Ask your health care provider, or the department that is doing the test:  When will my results be ready?  How will I get my results?  What are  my treatment options?  What other tests do I need?  What are my next steps? Summary  The hepatitis C testing  is done to check for a liver infection caused by the hepatitis C virus (HCV).  Hepatitis C is usually diagnosed with three blood tests and monitored with one test.  A blood sample is required for these tests. It is usually collected by inserting a needle into a blood vessel.  Your test results for both the anti-HCV test and the HCV RNA test will be reported as either positive or negative. This information is not intended to replace advice given to you by your health care provider. Make sure you discuss any questions you have with your health care provider. Document Revised: 11/24/2017 Document Reviewed: 10/30/2017 Elsevier Patient Education  2020 [employer].

## 2020-09-24 LAB — HEPATITIS C ANTIBODY: Hep C Virus Ab: 0.1 s/co ratio (ref 0.0–0.9)

## 2020-09-24 LAB — BMP8+EGFR
BUN/Creatinine Ratio: 14 (ref 10–24)
BUN: 16 mg/dL (ref 8–27)
CO2: 25 mmol/L (ref 20–29)
Calcium: 9.7 mg/dL (ref 8.6–10.2)
Chloride: 103 mmol/L (ref 96–106)
Creatinine, Ser: 1.15 mg/dL (ref 0.76–1.27)
GFR calc Af Amer: 73 mL/min/{1.73_m2} (ref >59)
GFR calc non Af Amer: 63 mL/min/{1.73_m2} (ref >59)
Glucose: 114 mg/dL — ABNORMAL HIGH (ref 65–99)
Potassium: 4.7 mmol/L (ref 3.5–5.2)
Sodium: 142 mmol/L (ref 134–144)

## 2020-09-24 LAB — HEMOGLOBIN A1C
Est. average glucose Bld gHb Est-mCnc: 114 mg/dL
Hgb A1c MFr Bld: 5.6 % (ref 4.8–5.6)

## 2020-10-15 DIAGNOSIS — H52223 Regular astigmatism, bilateral: Secondary | ICD-10-CM | POA: Diagnosis not present

## 2020-10-15 DIAGNOSIS — H25812 Combined forms of age-related cataract, left eye: Secondary | ICD-10-CM | POA: Diagnosis not present

## 2020-10-15 DIAGNOSIS — H5203 Hypermetropia, bilateral: Secondary | ICD-10-CM | POA: Diagnosis not present

## 2020-10-15 DIAGNOSIS — H524 Presbyopia: Secondary | ICD-10-CM | POA: Diagnosis not present

## 2020-11-20 ENCOUNTER — Other Ambulatory Visit: Payer: Self-pay | Admitting: Internal Medicine

## 2020-11-20 DIAGNOSIS — R109 Unspecified abdominal pain: Secondary | ICD-10-CM

## 2020-11-23 NOTE — Telephone Encounter (Signed)
Ibuprofen 800 refill

## 2020-12-23 ENCOUNTER — Encounter: Payer: Medicare Other | Admitting: Nurse Practitioner

## 2021-02-03 ENCOUNTER — Other Ambulatory Visit: Payer: Self-pay | Admitting: Nurse Practitioner

## 2021-02-03 DIAGNOSIS — R609 Edema, unspecified: Secondary | ICD-10-CM

## 2021-02-03 DIAGNOSIS — I10 Essential (primary) hypertension: Secondary | ICD-10-CM

## 2021-02-11 ENCOUNTER — Ambulatory Visit: Payer: Self-pay | Admitting: Nurse Practitioner

## 2021-03-18 ENCOUNTER — Encounter: Payer: Self-pay | Admitting: Nurse Practitioner

## 2021-03-18 ENCOUNTER — Other Ambulatory Visit: Payer: Self-pay

## 2021-03-18 ENCOUNTER — Ambulatory Visit (INDEPENDENT_AMBULATORY_CARE_PROVIDER_SITE_OTHER): Payer: Medicare HMO | Admitting: Nurse Practitioner

## 2021-03-18 VITALS — BP 130/80 | HR 60 | Temp 98.4°F | Ht 67.6 in | Wt 211.4 lb

## 2021-03-18 DIAGNOSIS — R413 Other amnesia: Secondary | ICD-10-CM | POA: Diagnosis not present

## 2021-03-18 DIAGNOSIS — R634 Abnormal weight loss: Secondary | ICD-10-CM | POA: Diagnosis not present

## 2021-03-18 DIAGNOSIS — I10 Essential (primary) hypertension: Secondary | ICD-10-CM

## 2021-03-18 DIAGNOSIS — Z1211 Encounter for screening for malignant neoplasm of colon: Secondary | ICD-10-CM | POA: Diagnosis not present

## 2021-03-18 NOTE — Patient Instructions (Signed)

## 2021-03-18 NOTE — Progress Notes (Signed)
I,Wesley Webb as a Education administrator for [employer], FNP.,have documented all relevant documentation on the behalf of Minette Brine, FNP,as directed by  Minette Brine, FNP while in the presence of Minette Brine, [place]. This visit occurred during the SARS-CoV-2 public health emergency.  Safety protocols were in place, including screening questions prior to the visit, additional usage of staff PPE, and extensive cleaning of exam room while observing appropriate contact time as indicated for disinfecting solutions.  Subjective:     Patient ID: Wesley Webb , male    DOB: 1946/07/19 , 75 y.o.   MRN: [id number]   Chief Complaint  Patient presents with  . Hypertension    HPI  Pt is here for HTN FU. His brother passed away about 2 months ago from kidney failure.   Wt Readings from Last 3 Encounters: 03/18/21 : 211 lb 6.4 oz (95.9 kg) 09/23/20 : 221 lb 6.4 oz (100.4 kg) 03/18/20 : 229 lb (103.9 kg)  He has been having a lost of appetite.    Hypertension This is a chronic problem. The current episode started more than 1 year ago. The problem is unchanged. Pertinent negatives include no anxiety, blurred vision, chest pain or palpitations. Risk factors for coronary artery disease include obesity and sedentary lifestyle.     Past Medical History:  Diagnosis Date  . Brain aneurysm   . Hypertension      Family History  Problem Relation Age of Onset  . Glaucoma Mother   . Hypertension Mother   . Hypertension Father   . Cancer Father   . Alcohol abuse Father   . Kidney disease Brother      Current Outpatient Medications:  .  COMBIGAN 0.2-0.5 % ophthalmic solution, , Disp: , Rfl:  .  diclofenac sodium (VOLTAREN) 1 % GEL, Apply 2 g topically 4 (four) times daily., Disp: 100 g, Rfl: 2 .  ibuprofen (ADVIL) 800 MG tablet, TAKE 1 TABLET BY MOUTH EVERY 8 HOURS AS NEEDED, Disp: 30 tablet, Rfl: 0 .  LUMIGAN 0.01 % SOLN, INSTILL 1 DROP INTO EACH EYE EVERY EVENING, Disp: , Rfl:  .   hydrochlorothiazide (HYDRODIURIL) 25 MG tablet, Take 1 tablet by mouth once daily (Patient not taking: Reported on 03/18/2021), Disp: 90 tablet, Rfl: 0   No Known Allergies   Review of Systems  Constitutional: Negative.   Eyes: Negative for blurred vision.  Respiratory: Negative.   Cardiovascular: Negative for chest pain, palpitations and leg swelling.  Genitourinary: Negative.   Skin: Negative.   Neurological: Negative.   Psychiatric/Behavioral: Negative.      Today's Vitals   03/18/21 1020  BP: 130/80  Pulse: 60  Temp: 98.4 F (36.9 C)  Weight: 211 lb 6.4 oz (95.9 kg)  Height: 5' 7.6" (1.717 m)  PainSc: 0-No pain   Body mass index is 32.52 kg/m.   Objective:  Physical Exam Vitals reviewed.  Constitutional:      General: He is not in acute distress.    Appearance: Normal appearance.  Cardiovascular:     Rate and Rhythm: Normal rate and regular rhythm.     Pulses: Normal pulses.     Heart sounds: Normal heart sounds. No murmur heard.   Pulmonary:     Effort: Pulmonary effort is normal. No respiratory distress.     Breath sounds: Normal breath sounds. No wheezing.  Neurological:     General: No focal deficit present.     Mental Status: He is alert and oriented to person, place, and  time.  Psychiatric:        Mood and Affect: Mood normal.        Behavior: Behavior normal.        Thought Content: Thought content normal.        Judgment: Judgment normal.         Assessment And Plan:     1. Essential hypertension  Chronic, blood pressure is well controlled  Continue with current medications  2. Weight loss  He has had a 20 lb weight loss in the last year  Will check for metabolic causes  Will also refer to GI for further evaluation - CBC - CMP14+EGFR - TSH - Vitamin B12 - Ambulatory referral to Gastroenterology  3. Encounter for screening colonoscopy  According to USPTF Colorectal cancer Screening guidelines. Colonoscopy is recommended every 10  years, starting at age 57 years.  Will refer to GI for colon cancer screening. - Ambulatory referral to Gastroenterology     Patient was given opportunity to ask questions. Patient verbalized understanding of the plan and was able to repeat key elements of the plan. All questions were answered to their satisfaction.  Minette Brine, FNP   I, Minette Brine, FNP, have reviewed all documentation for this visit. The documentation on 03/18/21 for the exam, diagnosis, procedures, and orders are all accurate and complete.   IF YOU HAVE BEEN REFERRED TO A SPECIALIST, IT MAY TAKE 1-2 WEEKS TO SCHEDULE/PROCESS THE REFERRAL. IF YOU HAVE NOT HEARD FROM US/SPECIALIST IN TWO WEEKS, PLEASE GIVE [place] A CALL AT [phone].   THE PATIENT IS ENCOURAGED TO PRACTICE SOCIAL DISTANCING DUE TO THE COVID-19 PANDEMIC.

## 2021-03-19 LAB — CMP14+EGFR
ALT: 11 IU/L (ref 0–44)
AST: 18 IU/L (ref 0–40)
Albumin/Globulin Ratio: 1 — ABNORMAL LOW (ref 1.2–2.2)
Albumin: 4.1 g/dL (ref 3.7–4.7)
Alkaline Phosphatase: 57 IU/L (ref 44–121)
BUN/Creatinine Ratio: 9 — ABNORMAL LOW (ref 10–24)
BUN: 10 mg/dL (ref 8–27)
Bilirubin Total: 0.5 mg/dL (ref 0.0–1.2)
CO2: 25 mmol/L (ref 20–29)
Calcium: 9.9 mg/dL (ref 8.6–10.2)
Chloride: 103 mmol/L (ref 96–106)
Creatinine, Ser: 1.17 mg/dL (ref 0.76–1.27)
Globulin, Total: 4.2 g/dL (ref 1.5–4.5)
Glucose: 114 mg/dL — ABNORMAL HIGH (ref 65–99)
Potassium: 5.1 mmol/L (ref 3.5–5.2)
Sodium: 140 mmol/L (ref 134–144)
Total Protein: 8.3 g/dL (ref 6.0–8.5)
eGFR: 65 mL/min/{1.73_m2} (ref >59)

## 2021-03-19 LAB — CBC
Hematocrit: 37.6 % (ref 37.5–51.0)
Hemoglobin: 12.4 g/dL — ABNORMAL LOW (ref 13.0–17.7)
MCH: 30.7 pg (ref 26.6–33.0)
MCHC: 33 g/dL (ref 31.5–35.7)
MCV: 93 fL (ref 79–97)
Platelets: 225 10*3/uL (ref 150–450)
RBC: 4.04 x10E6/uL — ABNORMAL LOW (ref 4.14–5.80)
RDW: 12.9 % (ref 11.6–15.4)
WBC: 4.5 10*3/uL (ref 3.4–10.8)

## 2021-03-19 LAB — VITAMIN B12: Vitamin B-12: 398 pg/mL (ref 232–1245)

## 2021-03-19 LAB — TSH: TSH: 1.91 u[IU]/mL (ref 0.450–4.500)

## 2021-03-25 ENCOUNTER — Telehealth: Payer: Self-pay

## 2021-03-25 ENCOUNTER — Ambulatory Visit: Payer: Medicare Other

## 2021-03-25 ENCOUNTER — Encounter: Payer: Medicare Other | Admitting: Nurse Practitioner

## 2021-03-25 NOTE — Telephone Encounter (Signed)
This nurse attempted to call patient in regards to missing today's scheduled AWV. Message left for patient to call back in order to reschedule for another time.

## 2021-04-15 ENCOUNTER — Other Ambulatory Visit: Payer: Self-pay

## 2021-04-15 ENCOUNTER — Ambulatory Visit (INDEPENDENT_AMBULATORY_CARE_PROVIDER_SITE_OTHER): Payer: Medicare HMO

## 2021-04-15 VITALS — BP 138/80 | HR 50 | Temp 98.4°F | Ht 68.0 in | Wt 213.6 lb

## 2021-04-15 DIAGNOSIS — Z0000 Encounter for general adult medical examination without abnormal findings: Secondary | ICD-10-CM | POA: Diagnosis not present

## 2021-04-15 DIAGNOSIS — Z1211 Encounter for screening for malignant neoplasm of colon: Secondary | ICD-10-CM

## 2021-04-15 NOTE — Patient Instructions (Signed)
Wesley Webb , Thank you for taking time to come for your Medicare Wellness Visit. I appreciate your ongoing commitment to your health goals. Please review the following plan we discussed and let me know if I can assist you in the future.   Screening recommendations/referrals: Colonoscopy: ordered today Recommended yearly ophthalmology/optometry visit for glaucoma screening and checkup Recommended yearly dental visit for hygiene and checkup  Vaccinations: Influenza vaccine: decline Pneumococcal vaccine: decline Tdap vaccine: decline Shingles vaccine:  decline   Covid-19:  09/01/2020, 03/17/2020, 02/17/2020  Advanced directives: Advance directive discussed with you today. Even though you declined this today please call our office should you change your mind and we can give you the proper paperwork for you to fill out.  Conditions/risks identified: none  Next appointment: Follow up in one year for your annual wellness visit.   Preventive Care 38 Years and Older, Male Preventive care refers to lifestyle choices and visits with your health care provider that can promote health and wellness. What does preventive care include?  A yearly physical exam. This is also called an annual well check.  Dental exams once or twice a year.  Routine eye exams. Ask your health care provider how often you should have your eyes checked.  Personal lifestyle choices, including:  Daily care of your teeth and gums.  Regular physical activity.  Eating a healthy diet.  Avoiding tobacco and drug use.  Limiting alcohol use.  Practicing safe sex.  Taking low doses of aspirin every day.  Taking vitamin and mineral supplements as recommended by your health care provider. What happens during an annual well check? The services and screenings done by your health care provider during your annual well check will depend on your age, overall health, lifestyle risk factors, and family history of  disease. Counseling  Your health care provider may ask you questions about your:  Alcohol use.  Tobacco use.  Drug use.  Emotional well-being.  Home and relationship well-being.  Sexual activity.  Eating habits.  History of falls.  Memory and ability to understand (cognition).  Work and work Astronomer. Screening  You may have the following tests or measurements:  Height, weight, and BMI.  Blood pressure.  Lipid and cholesterol levels. These may be checked every 5 years, or more frequently if you are over 77 years old.  Skin check.  Lung cancer screening. You may have this screening every year starting at age 31 if you have a 30-pack-year history of smoking and currently smoke or have quit within the past 15 years.  Fecal occult blood test (FOBT) of the stool. You may have this test every year starting at age 23.  Flexible sigmoidoscopy or colonoscopy. You may have a sigmoidoscopy every 5 years or a colonoscopy every 10 years starting at age 67.  Prostate cancer screening. Recommendations will vary depending on your family history and other risks.  Hepatitis C blood test.  Hepatitis B blood test.  Sexually transmitted disease (STD) testing.  Diabetes screening. This is done by checking your blood sugar (glucose) after you have not eaten for a while (fasting). You may have this done every 1-3 years.  Abdominal aortic aneurysm (AAA) screening. You may need this if you are a current or former smoker.  Osteoporosis. You may be screened starting at age 40 if you are at high risk. Talk with your health care provider about your test results, treatment options, and if necessary, the need for more tests. Vaccines  Your health care provider  may recommend certain vaccines, such as:  Influenza vaccine. This is recommended every year.  Tetanus, diphtheria, and acellular pertussis (Tdap, Td) vaccine. You may need a Td booster every 10 years.  Zoster vaccine. You may  need this after age 39.  Pneumococcal 13-valent conjugate (PCV13) vaccine. One dose is recommended after age 56.  Pneumococcal polysaccharide (PPSV23) vaccine. One dose is recommended after age 45. Talk to your health care provider about which screenings and vaccines you need and how often you need them. This information is not intended to replace advice given to you by your health care provider. Make sure you discuss any questions you have with your health care provider. Document Released: 01/08/2016 Document Revised: 08/31/2016 Document Reviewed: 10/13/2015 Elsevier Interactive Patient Education  2017 [place] Prevention in the Home Falls can cause injuries. They can happen to people of all ages. There are many things you can do to make your home safe and to help prevent falls. What can I do on the outside of my home?  Regularly fix the edges of walkways and driveways and fix any cracks.  Remove anything that might make you trip as you walk through a door, such as a raised step or threshold.  Trim any bushes or trees on the path to your home.  Use bright outdoor lighting.  Clear any walking paths of anything that might make someone trip, such as rocks or tools.  Regularly check to see if handrails are loose or broken. Make sure that both sides of any steps have handrails.  Any raised decks and porches should have guardrails on the edges.  Have any leaves, snow, or ice cleared regularly.  Use sand or salt on walking paths during winter.  Clean up any spills in your garage right away. This includes oil or grease spills. What can I do in the bathroom?  Use night lights.  Install grab bars by the toilet and in the tub and shower. Do not use towel bars as grab bars.  Use non-skid mats or decals in the tub or shower.  If you need to sit down in the shower, use a plastic, non-slip stool.  Keep the floor dry. Clean up any water that spills on the floor as soon as it  happens.  Remove soap buildup in the tub or shower regularly.  Attach bath mats securely with double-sided non-slip rug tape.  Do not have throw rugs and other things on the floor that can make you trip. What can I do in the bedroom?  Use night lights.  Make sure that you have a light by your bed that is easy to reach.  Do not use any sheets or blankets that are too big for your bed. They should not hang down onto the floor.  Have a firm chair that has side arms. You can use this for support while you get dressed.  Do not have throw rugs and other things on the floor that can make you trip. What can I do in the kitchen?  Clean up any spills right away.  Avoid walking on wet floors.  Keep items that you use a lot in easy-to-reach places.  If you need to reach something above you, use a strong step stool that has a grab bar.  Keep electrical cords out of the way.  Do not use floor polish or wax that makes floors slippery. If you must use wax, use non-skid floor wax.  Do not have throw rugs  and other things on the floor that can make you trip. What can I do with my stairs?  Do not leave any items on the stairs.  Make sure that there are handrails on both sides of the stairs and use them. Fix handrails that are broken or loose. Make sure that handrails are as long as the stairways.  Check any carpeting to make sure that it is firmly attached to the stairs. Fix any carpet that is loose or worn.  Avoid having throw rugs at the top or bottom of the stairs. If you do have throw rugs, attach them to the floor with carpet tape.  Make sure that you have a light switch at the top of the stairs and the bottom of the stairs. If you do not have them, ask someone to add them for you. What else can I do to help prevent falls?  Wear shoes that:  Do not have high heels.  Have rubber bottoms.  Are comfortable and fit you well.  Are closed at the toe. Do not wear sandals.  If you  use a stepladder:  Make sure that it is fully opened. Do not climb a closed stepladder.  Make sure that both sides of the stepladder are locked into place.  Ask someone to hold it for you, if possible.  Clearly mark and make sure that you can see:  Any grab bars or handrails.  First and last steps.  Where the edge of each step is.  Use tools that help you move around (mobility aids) if they are needed. These include:  Canes.  Walkers.  Scooters.  Crutches.  Turn on the lights when you go into a dark area. Replace any light bulbs as soon as they burn out.  Set up your furniture so you have a clear path. Avoid moving your furniture around.  If any of your floors are uneven, fix them.  If there are any pets around you, be aware of where they are.  Review your medicines with your doctor. Some medicines can make you feel dizzy. This can increase your chance of falling. Ask your doctor what other things that you can do to help prevent falls. This information is not intended to replace advice given to you by your health care provider. Make sure you discuss any questions you have with your health care provider. Document Released: 10/08/2009 Document Revised: 05/19/2016 Document Reviewed: 01/16/2015 Elsevier Interactive Patient Education  2017 [employer].

## 2021-04-15 NOTE — Progress Notes (Signed)
This visit occurred during the SARS-CoV-2 public health emergency.  Safety protocols were in place, including screening questions prior to the visit, additional usage of staff PPE, and extensive cleaning of exam room while observing appropriate contact time as indicated for disinfecting solutions.  Subjective:   Wesley Webb is a 75 y.o. male who presents for Medicare Annual/Subsequent preventive examination.  Review of Systems     Cardiac Risk Factors include: advanced age (>3155men, 54>65 women);dyslipidemia;hypertension;male gender;obesity (BMI >30kg/m2);sedentary lifestyle     Objective:    Today's Vitals   04/15/21 1430  BP: 138/80  Pulse: (!) 50  Temp: 98.4 F (36.9 C)  TempSrc: Oral  SpO2: 99%  Weight: 213 lb 9.6 oz (96.9 kg)  Height: 5\' 8"  (1.727 m)   Body mass index is 32.48 kg/m.  Advanced Directives 04/15/2021 03/18/2020 06/07/2019 03/14/2019  Does Patient Have a Medical Advance Directive? No Yes No No  Type of Advance Directive - Healthcare Power of [place];Living will - -  Copy of Healthcare Power of Attorney in Chart? - No - copy requested - -  Would patient like information on creating a medical advance directive? No - Patient declined - - No - Patient declined    Current Medications (verified) Outpatient Encounter Medications as of 04/15/2021  Medication Sig  . COMBIGAN 0.2-0.5 % ophthalmic solution   . diclofenac sodium (VOLTAREN) 1 % GEL Apply 2 g topically 4 (four) times daily.  . hydrochlorothiazide (HYDRODIURIL) 25 MG tablet Take 1 tablet by mouth once daily (Patient not taking: Reported on 03/18/2021)  . ibuprofen (ADVIL) 800 MG tablet TAKE 1 TABLET BY MOUTH EVERY 8 HOURS AS NEEDED  . LUMIGAN 0.01 % SOLN INSTILL 1 DROP INTO EACH EYE EVERY EVENING  . [DISCONTINUED] amLODipine (NORVASC) 10 MG tablet Take 1 tablet by mouth once daily   No facility-administered encounter medications on file as of 04/15/2021.    Allergies (verified) Patient has no known  allergies.   History: Past Medical History:  Diagnosis Date  . Brain aneurysm   . Hypertension    Past Surgical History:  Procedure Laterality Date  . SHOULDER SURGERY    . 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     Family History  Problem Relation Age of Onset  . Glaucoma Mother   . Hypertension Mother   . Hypertension Father   . Cancer Father   . Alcohol abuse Father   . Kidney disease Brother    Social History   Socioeconomic History  . Marital status: Married    Spouse name: Not on file  . Number of children: Not on file  . Years of education: Not on file  . Highest education level: Not on file  Occupational History  . Occupation: works part Research officer, trade uniontime security  Tobacco Use  . Smoking status: Never Smoker  . Smokeless tobacco: Never Used  Vaping Use  . Vaping Use: Never used  Substance and Sexual Activity  . Alcohol use: No  . Drug use: No  . Sexual activity: Yes  Other Topics Concern  . Not on file  Social History Narrative  . Not on file   Social Determinants of Health   Financial Resource Strain: Low Risk   . Difficulty of Paying Living Expenses: Not hard at all  Food Insecurity: No Food Insecurity  . Worried About Programme researcher, broadcasting/film/videounning Out of Food in the Last Year: Never true  . Ran Out of Food in the Last Year: Never true  Transportation Needs: No Transportation Needs  . Lack  of Transportation (Medical): No  . Lack of Transportation (Non-Medical): No  Physical Activity: Inactive  . Days of Exercise per Week: 0 days  . Minutes of Exercise per Session: 0 min  Stress: No Stress Concern Present  . Feeling of Stress : Not at all  Social Connections: Not on file    Tobacco Counseling Counseling given: Not Answered   Clinical Intake:  Pre-visit preparation completed: Yes  Pain : No/denies pain     Nutritional Status: BMI > 30  Obese Nutritional Risks: None Diabetes: No  How often do you need to have someone help you when you read instructions, pamphlets, or other written  materials from your doctor or pharmacy?: 1 - Never What is the last grade level you completed in school?: 11th grade  Diabetic? no  Interpreter Needed?: No  Information entered by :: NAllen LPN   Activities of Daily Living In your present state of health, do you have any difficulty performing the following activities: 04/15/2021  Hearing? N  Vision? N  Difficulty concentrating or making decisions? N  Walking or climbing stairs? N  Dressing or bathing? N  Doing errands, shopping? N  Preparing Food and eating ? N  Using the Toilet? N  In the past six months, have you accidently leaked urine? Y  Do you have problems with loss of bowel control? N  Managing your Medications? N  Managing your Finances? N  Housekeeping or managing your Housekeeping? N  Some recent data might be hidden    Patient Care Team: Arnette Felts, FNP as PCP - General (General Practice) Chalmers Guest, MD as Consulting Physician (Ophthalmology)  Indicate any recent Medical Services you may have received from other than [facility] providers in the past year (date may be approximate).     Assessment:   This is a routine wellness examination for [place].  Hearing/Vision screen  Hearing Screening   125Hz  250Hz  500Hz  1000Hz  2000Hz  3000Hz  4000Hz  6000Hz  8000Hz   Right ear:           Left ear:           Vision Screening Comments: Regular eye exams, Dr.  Dietary issues and exercise activities discussed: Current Exercise Habits: The patient does not participate in regular exercise at present  Goals    .  Exercise 150 min/wk Moderate Activity (pt-stated)      Wants to walk more    .  Patient Stated      04/15/2021, move a little faster    .  Weight (lb) < 200 lb (90.7 kg)      03/18/2020, want to lose 30 pounds      Depression Screen PHQ 2/9 Scores 04/15/2021 03/18/2021 03/18/2020 03/18/2020 09/05/2019 05/30/2019 03/14/2019  PHQ - 2 Score 0 0 0 0 0 0 0  PHQ- 9 Score - - 0 - - - 0    Fall Risk Fall Risk   04/15/2021 03/18/2021 03/18/2020 03/18/2020 09/05/2019  Falls in the past year? 0 0 0 0 0  Risk for fall due to : Medication side effect - Medication side effect - -  Follow up Falls evaluation completed;Education provided;Falls prevention discussed - Falls evaluation completed;Education provided;Falls prevention discussed - -    FALL RISK PREVENTION PERTAINING TO THE HOME:  Any stairs in or around the home? Yes  If so, are there any without handrails? No  Home free of loose throw rugs in walkways, pet beds, electrical cords, etc? Yes  Adequate lighting in your home to reduce  risk of falls? Yes   ASSISTIVE DEVICES UTILIZED TO PREVENT FALLS:  Life alert? No  Use of a cane, walker or w/c? No  Grab bars in the bathroom? Yes  Shower chair or bench in shower? Yes  Elevated toilet seat or a handicapped toilet? Yes   TIMED UP AND GO:  Was the test performed? No .  Cognitive Function:     6CIT Screen 04/15/2021 03/18/2020 03/14/2019  What Year? 0 points 0 points 0 points  What month? 0 points 0 points 0 points  What time? 3 points 0 points 3 points  Count back from 20 2 points 0 points 0 points  Months in reverse 4 points 4 points 4 points  Repeat phrase 10 points 2 points 2 points  Total Score 19 6 9     Immunizations Immunization History  Administered Date(s) Administered  . Moderna Sars-Covid-2 Vaccination 02/17/2020, 03/17/2020, 09/01/2020    TDAP status: Due, Education has been provided regarding the importance of this vaccine. Advised may receive this vaccine at local pharmacy or Health Dept. Aware to provide a copy of the vaccination record if obtained from local pharmacy or Health Dept. Verbalized acceptance and understanding.  Flu Vaccine status: Declined, Education has been provided regarding the importance of this vaccine but patient still declined. Advised may receive this vaccine at local pharmacy or Health Dept. Aware to provide a copy of the vaccination record if obtained  from local pharmacy or Health Dept. Verbalized acceptance and understanding.  Pneumococcal vaccine status: Declined,  Education has been provided regarding the importance of this vaccine but patient still declined. Advised may receive this vaccine at local pharmacy or Health Dept. Aware to provide a copy of the vaccination record if obtained from local pharmacy or Health Dept. Verbalized acceptance and understanding.   Covid-19 vaccine status: Completed vaccines  Qualifies for Shingles Vaccine? Yes   Zostavax completed No   Shingrix Completed?: No.    Education has been provided regarding the importance of this vaccine. Patient has been advised to call insurance company to determine out of pocket expense if they have not yet received this vaccine. Advised may also receive vaccine at local pharmacy or Health Dept. Verbalized acceptance and understanding.  Screening Tests Health Maintenance  Topic Date Due  . COLONOSCOPY (Pts 45-53yrs Insurance coverage will need to be confirmed)  Never done  . TETANUS/TDAP  06/21/2021 (Originally 02/26/1965)  . PNA vac Low Risk Adult (1 of 2 - PCV13) 06/21/2021 (Originally 02/27/2012)  . INFLUENZA VACCINE  07/26/2021  . COVID-19 Vaccine  Completed  . Hepatitis C Screening  Completed  . HPV VACCINES  Aged Out    Health Maintenance  Health Maintenance Due  Topic Date Due  . COLONOSCOPY (Pts 45-74yrs Insurance coverage will need to be confirmed)  Never done    Colorectal cancer screening: Referral to GI placed today. Pt aware the office will call re: appt.  Lung Cancer Screening: (Low Dose CT Chest recommended if Age 75-80 years, 30 pack-year currently smoking OR have quit w/in 15years.) does not qualify.   Lung Cancer Screening Referral: no  Additional Screening:  Hepatitis C Screening: does qualify; Completed 09/23/2020  Vision Screening: Recommended annual ophthalmology exams for early detection of glaucoma and other disorders of the eye. Is the  patient up to date with their annual eye exam?  Yes  Who is the provider or what is the name of the office in which the patient attends annual eye exams? Dr. 09/25/2020 If pt is  not established with a provider, would they like to be referred to a provider to establish care? No .   Dental Screening: Recommended annual dental exams for proper oral hygiene  Community Resource Referral / Chronic Care Management: CRR required this visit?  No   CCM required this visit?  No      Plan:     I have personally reviewed and noted the following in the patient's chart:   . Medical and social history . Use of alcohol, tobacco or illicit drugs  . Current medications and supplements . Functional ability and status . Nutritional status . Physical activity . Advanced directives . List of other physicians . Hospitalizations, surgeries, and ER visits in previous 12 months . Vitals . Screenings to include cognitive, depression, and falls . Referrals and appointments  In addition, I have reviewed and discussed with patient certain preventive protocols, quality metrics, and best practice recommendations. A written personalized care plan for preventive services as well as general preventive health recommendations were provided to patient.     Barb Merino, LPN   [id number]   Nurse Notes:

## 2021-06-23 DIAGNOSIS — E291 Testicular hypofunction: Secondary | ICD-10-CM | POA: Diagnosis not present

## 2021-06-23 DIAGNOSIS — N401 Enlarged prostate with lower urinary tract symptoms: Secondary | ICD-10-CM | POA: Diagnosis not present

## 2021-06-23 DIAGNOSIS — R948 Abnormal results of function studies of other organs and systems: Secondary | ICD-10-CM | POA: Diagnosis not present

## 2021-06-23 DIAGNOSIS — R351 Nocturia: Secondary | ICD-10-CM | POA: Diagnosis not present

## 2021-07-07 ENCOUNTER — Other Ambulatory Visit: Payer: Self-pay | Admitting: Nurse Practitioner

## 2021-07-07 DIAGNOSIS — R609 Edema, unspecified: Secondary | ICD-10-CM

## 2021-07-07 DIAGNOSIS — I10 Essential (primary) hypertension: Secondary | ICD-10-CM

## 2021-08-13 ENCOUNTER — Other Ambulatory Visit: Payer: Self-pay

## 2021-08-13 MED ORDER — DICLOFENAC SODIUM 0.1 % OP SOLN: OPHTHALMIC | 1 refills | Status: DC

## 2021-08-16 ENCOUNTER — Other Ambulatory Visit: Payer: Self-pay | Admitting: Nurse Practitioner

## 2021-08-16 MED ORDER — DICLOFENAC SODIUM 1 % EX GEL: 2.0000 g | Freq: Four times a day (QID) | CUTANEOUS | 2 refills | Status: DC

## 2021-09-08 ENCOUNTER — Other Ambulatory Visit: Payer: Self-pay

## 2021-09-08 ENCOUNTER — Ambulatory Visit (INDEPENDENT_AMBULATORY_CARE_PROVIDER_SITE_OTHER): Payer: Medicare HMO | Admitting: Nurse Practitioner

## 2021-09-08 ENCOUNTER — Encounter: Payer: Self-pay | Admitting: Nurse Practitioner

## 2021-09-08 VITALS — BP 130/70 | HR 60 | Temp 98.4°F | Ht 67.2 in | Wt 203.2 lb

## 2021-09-08 DIAGNOSIS — I10 Essential (primary) hypertension: Secondary | ICD-10-CM

## 2021-09-08 DIAGNOSIS — Z113 Encounter for screening for infections with a predominantly sexual mode of transmission: Secondary | ICD-10-CM | POA: Diagnosis not present

## 2021-09-08 DIAGNOSIS — R7303 Prediabetes: Secondary | ICD-10-CM | POA: Diagnosis not present

## 2021-09-08 DIAGNOSIS — Z2821 Immunization not carried out because of patient refusal: Secondary | ICD-10-CM

## 2021-09-08 DIAGNOSIS — E6609 Other obesity due to excess calories: Secondary | ICD-10-CM

## 2021-09-08 DIAGNOSIS — Z0000 Encounter for general adult medical examination without abnormal findings: Secondary | ICD-10-CM | POA: Diagnosis not present

## 2021-09-08 DIAGNOSIS — R600 Localized edema: Secondary | ICD-10-CM | POA: Diagnosis not present

## 2021-09-08 DIAGNOSIS — R413 Other amnesia: Secondary | ICD-10-CM | POA: Diagnosis not present

## 2021-09-08 DIAGNOSIS — R21 Rash and other nonspecific skin eruption: Secondary | ICD-10-CM | POA: Diagnosis not present

## 2021-09-08 DIAGNOSIS — R634 Abnormal weight loss: Secondary | ICD-10-CM

## 2021-09-08 DIAGNOSIS — E782 Mixed hyperlipidemia: Secondary | ICD-10-CM

## 2021-09-08 DIAGNOSIS — Z6831 Body mass index (BMI) 31.0-31.9, adult: Secondary | ICD-10-CM

## 2021-09-08 LAB — POCT URINALYSIS DIPSTICK
Bilirubin, UA: NEGATIVE
Glucose, UA: NEGATIVE
Ketones, UA: NEGATIVE
Leukocytes, UA: NEGATIVE
Nitrite, UA: NEGATIVE
Protein, UA: NEGATIVE
Spec Grav, UA: 1.02 (ref 1.010–1.025)
Urobilinogen, UA: 0.2 E.U./dL
pH, UA: 6 (ref 5.0–8.0)

## 2021-09-08 MED ORDER — HYDROCORTISONE 1 % EX CREA: TOPICAL_CREAM | CUTANEOUS | 1 refills | Status: DC

## 2021-09-08 NOTE — Progress Notes (Signed)
I,Tianna Badgett,acting as a Education administrator for [employer], FNP.,have documented all relevant documentation on the behalf of Minette Brine, FNP,as directed by  Minette Brine, FNP while in the presence of Minette Brine, [place].  This visit occurred during the SARS-CoV-2 public health emergency.  Safety protocols were in place, including screening questions prior to the visit, additional usage of staff PPE, and extensive cleaning of exam room while observing appropriate contact time as indicated for disinfecting solutions.  Subjective:     Patient ID: Wesley Webb , male    DOB: 1946/04/03 , 75 y.o.   MRN: [id number]   Chief Complaint  Patient presents with   Annual Exam    HPI  Here for HM.  He reports he has been eating a good amount of sweets  Wt Readings from Last 3 Encounters: 09/08/21 : 203 lb 3.2 oz (92.2 kg) 04/15/21 : 213 lb 9.6 oz (96.9 kg) 03/18/21 : 211 lb 6.4 oz (95.9 kg)  He does report having bed bugs at his office His daughter verbalizes he had been sitting with bed bugs on him, all over his clothes - they are working on improving this. She does not feel like this is normal for him. They feel stuck between a rock and a hard place. 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 He will go back and pull them out. These changes started during covid more sporadic with his behavior. Was on phone with daughter for 20 minutes     Past Medical History:  Diagnosis Date   Brain aneurysm    Hypertension      Family History  Problem Relation Age of Onset   Glaucoma Mother    Hypertension Mother    Hypertension Father    Cancer Father    Alcohol abuse Father    Kidney disease Brother      Current Outpatient Medications:    hydrocortisone cream 1 %, Apply to affected area 2 times daily, Disp: 30 g, Rfl: 1   COMBIGAN 0.2-0.5 % ophthalmic solution, , Disp: , Rfl:    diclofenac Sodium (VOLTAREN) 1 % GEL, Apply 2 g topically 4 (four) times daily., Disp: 100 g, Rfl: 2   hydrochlorothiazide  (HYDRODIURIL) 25 MG tablet, Take 1 tablet by mouth once daily, Disp: 90 tablet, Rfl: 0   ibuprofen (ADVIL) 800 MG tablet, TAKE 1 TABLET BY MOUTH EVERY 8 HOURS AS NEEDED, Disp: 30 tablet, Rfl: 0   LUMIGAN 0.01 % SOLN, INSTILL 1 DROP INTO EACH EYE EVERY EVENING, Disp: , Rfl:    No Known Allergies   Men's preventive visit. Patient Health Questionnaire (PHQ-2) is  Flowsheet Row Clinical Support from 04/15/2021 in [facility]  PHQ-2 Total Score 0      Patient is on a Regular diet. Marital status: Married. Relevant history for alcohol use is:  Social History   Substance and Sexual Activity  Alcohol Use No  . Relevant history for tobacco use is:  Social History   Tobacco Use  Smoking Status Never  Smokeless Tobacco Never  .   Review of Systems  Constitutional: Negative.   HENT: Negative.    Eyes: Negative.   Respiratory: Negative.    Cardiovascular: Negative.  Negative for chest pain, palpitations and leg swelling.  Gastrointestinal: Negative.   Endocrine: Negative.   Genitourinary: Negative.   Musculoskeletal: Negative.   Skin:  Positive for rash.  Allergic/Immunologic: Negative.   Neurological: Negative.   Hematological: Negative.   Psychiatric/Behavioral: Negative.  Today's Vitals   09/08/21 1031  BP: 130/70  Pulse: 60  Temp: 98.4 F (36.9 C)  TempSrc: Oral  Weight: 203 lb 3.2 oz (92.2 kg)  Height: 5' 7.2" (1.707 m)   Body mass index is 31.64 kg/m.  Wt Readings from Last 3 Encounters:  09/08/21 203 lb 3.2 oz (92.2 kg)  04/15/21 213 lb 9.6 oz (96.9 kg)  03/18/21 211 lb 6.4 oz (95.9 kg)    Objective:  Physical Exam Vitals reviewed.  Constitutional:      General: He is not in acute distress.    Appearance: Normal appearance. He is obese.  HENT:     Head: Normocephalic and atraumatic.     Right Ear: Tympanic membrane, ear canal and external ear normal. There is no impacted cerumen.     Left Ear: Tympanic membrane, ear canal and  external ear normal. There is no impacted cerumen.     Nose:     Comments: Deferred - mask    Mouth/Throat:     Comments: Deferred - mask  Eyes:     Extraocular Movements: Extraocular movements intact.     Conjunctiva/sclera: Conjunctivae normal.     Pupils: Pupils are equal, round, and reactive to light.  Cardiovascular:     Rate and Rhythm: Normal rate and regular rhythm.     Pulses: Normal pulses.     Heart sounds: Normal heart sounds. No murmur heard. Pulmonary:     Effort: Pulmonary effort is normal. No respiratory distress.     Breath sounds: Normal breath sounds.  Abdominal:     General: Abdomen is flat. Bowel sounds are normal. There is no distension.     Palpations: Abdomen is soft.  Genitourinary:    Prostate: Normal.     Rectum: Guaiac result negative.  Musculoskeletal:        General: Swelling (bilateral lower extremities with nonpitting edema) present. Normal range of motion.     Cervical back: Normal range of motion and neck supple.     Right lower leg: No edema.     Left lower leg: No edema.  Skin:    General: Skin is warm.     Capillary Refill: Capillary refill takes less than 2 seconds.  Neurological:     General: No focal deficit present.     Mental Status: He is alert and oriented to person, place, and time.     Cranial Nerves: No cranial nerve deficit.  Psychiatric:        Mood and Affect: Mood normal.        Behavior: Behavior normal.        Thought Content: Thought content normal.        Judgment: Judgment normal.        Assessment And Plan:    1. Health maintenance examination Behavior modifications discussed and diet history reviewed.   Pt will continue to exercise regularly and modify diet with low GI, plant based foods and decrease intake of processed foods.  Recommend intake of daily multivitamin, Vitamin D, and calcium.  Recommend colonoscopy for preventive screenings, as well as recommend immunizations that include influenza, TDAP, and  Shingles (declines vaccines) I did see a bed bug on his clothes, I discussed with him as well to get an exterminator to check his home.  - CBC  2. Essential hypertension B/P is controlled.  CMP ordered to check renal function.  The importance of regular exercise and dietary modification was stressed to the patient.  Stressed importance of  losing ten percent of her body weight to help with B/P control.  The weight loss would help with decreasing cardiac and cancer risk as well.  EKG done with sinus bradycardia - POCT Urinalysis Dipstick (81002) - Microalbumin / Creatinine Urine Ratio - EKG 12-Lead  3. Mixed hyperlipidemia Chronic, controlled No current medications - Lipid panel  4. Prediabetes Chronic, no current medications - Hemoglobin A1c - CMP14+EGFR  5. Influenza vaccination declined Patient declined influenza vaccination at this time. Patient is aware that influenza vaccine prevents illness in 70% of healthy people, and reduces hospitalizations to 30-70% in elderly. This vaccine is recommended annually. Pt is willing to accept risk associated with refusing vaccination.  6. Class 1 obesity due to excess calories with serious comorbidity and body mass index (BMI) of 31.0 to 31.9 in adult Chronic Discussed healthy diet and regular exercise options  Encouraged to exercise at least 150 minutes per week as tolerated  7. Tetanus, diphtheria, and acellular pertussis (Tdap) vaccination declined  8. Lower extremity edema Has had continued lower leg swelling will check for insufficiency - VAS [place] LOWER EXTREMITY VENOUS REFLUX; Future  9. Rash and nonspecific skin eruption - hydrocortisone cream 1 %; Apply to affected area 2 times daily  Dispense: 30 g; Refill: 1  10. Memory changes His daughter is concerned about his memory will check for possible syphyllis. 6CIT score was 19 from 6. This is concerning for cognitive impairment - T pallidum Screening [facility] - CT HEAD WO CONTRAST  (5MM); Future  11. Weight loss Wt Readings from Last 3 Encounters:  09/08/21 203 lb 3.2 oz (92.2 kg)  04/15/21 213 lb 9.6 oz (96.9 kg)  03/18/21 211 lb 6.4 oz (95.9 kg)   He has had a 10 lb weight loss 10 lbs since April.  - Ambulatory referral to Gastroenterology  Long conversation with the patients daughter via phone about his behaviors. I will check a CT scan of the head.  She does report they have been dealing with bed bugs.    Patient was given opportunity to ask questions. Patient verbalized understanding of the plan and was able to repeat key elements of the plan. All questions were answered to their satisfaction.   Minette Brine, FNP   I, Minette Brine, FNP, have reviewed all documentation for this visit. The documentation on 09/16/21 for the exam, diagnosis, procedures, and orders are all accurate and complete.  THE PATIENT IS ENCOURAGED TO PRACTICE SOCIAL DISTANCING DUE TO THE COVID-19 PANDEMIC.

## 2021-09-08 NOTE — Patient Instructions (Signed)
Health Maintenance, Male Adopting a healthy lifestyle and getting preventive care are important in promoting health and wellness. Ask your health care provider about: The right schedule for you to have regular tests and exams. Things you can do on your own to prevent diseases and keep yourself healthy. What should I know about diet, weight, and exercise? Eat a healthy diet  Eat a diet that includes plenty of vegetables, fruits, low-fat dairy products, and lean protein. Do not eat a lot of foods that are high in solid fats, added sugars, or sodium. Maintain a healthy weight Body mass index (BMI) is a measurement that can be used to identify possible weight problems. It estimates body fat based on height and weight. Your health care provider can help determine your BMI and help you achieve or maintain a healthy weight. Get regular exercise Get regular exercise. This is one of the most important things you can do for your health. Most adults should: Exercise for at least 150 minutes each week. The exercise should increase your heart rate and make you sweat (moderate-intensity exercise). Do strengthening exercises at least twice a week. This is in addition to the moderate-intensity exercise. Spend less time sitting. Even light physical activity can be beneficial. Watch cholesterol and blood lipids Have your blood tested for lipids and cholesterol at 74 years of age, then have this test every 5 years. You may need to have your cholesterol levels checked more often if: Your lipid or cholesterol levels are high. You are older than 74 years of age. You are at high risk for heart disease. What should I know about cancer screening? Many types of cancers can be detected early and may often be prevented. Depending on your health history and family history, you may need to have cancer screening at various ages. This may include screening for: Colorectal cancer. Prostate cancer. Skin cancer. Lung  cancer. What should I know about heart disease, diabetes, and high blood pressure? Blood pressure and heart disease High blood pressure causes heart disease and increases the risk of stroke. This is more likely to develop in people who have high blood pressure readings, are of African descent, or are overweight. Talk with your health care provider about your target blood pressure readings. Have your blood pressure checked: Every 3-5 years if you are 18-39 years of age. Every year if you are 40 years old or older. If you are between the ages of 65 and 75 and are a current or former smoker, ask your health care provider if you should have a one-time screening for abdominal aortic aneurysm (AAA). Diabetes Have regular diabetes screenings. This checks your fasting blood sugar level. Have the screening done: Once every three years after age 45 if you are at a normal weight and have a low risk for diabetes. More often and at a younger age if you are overweight or have a high risk for diabetes. What should I know about preventing infection? Hepatitis B If you have a higher risk for hepatitis B, you should be screened for this virus. Talk with your health care provider to find out if you are at risk for hepatitis B infection. Hepatitis C Blood testing is recommended for: Everyone born from 1945 through 1965. Anyone with known risk factors for hepatitis C. Sexually transmitted infections (STIs) You should be screened each year for STIs, including gonorrhea and chlamydia, if: You are sexually active and are younger than 74 years of age. You are older than 74 years   of age and your health care provider tells you that you are at risk for this type of infection. Your sexual activity has changed since you were last screened, and you are at increased risk for chlamydia or gonorrhea. Ask your health care provider if you are at risk. Ask your health care provider about whether you are at high risk for HIV.  Your health care provider may recommend a prescription medicine to help prevent HIV infection. If you choose to take medicine to prevent HIV, you should first get tested for HIV. You should then be tested every 3 months for as long as you are taking the medicine. Follow these instructions at home: Lifestyle Do not use any products that contain nicotine or tobacco, such as cigarettes, e-cigarettes, and chewing tobacco. If you need help quitting, ask your health care provider. Do not use street drugs. Do not share needles. Ask your health care provider for help if you need support or information about quitting drugs. Alcohol use Do not drink alcohol if your health care provider tells you not to drink. If you drink alcohol: Limit how much you have to 0-2 drinks a day. Be aware of how much alcohol is in your drink. In the [place] one drink equals one 12 oz bottle of beer (355 mL), one 5 oz glass of wine (148 mL), or one 1 oz glass of hard liquor (44 mL). General instructions Schedule regular health, dental, and eye exams. Stay current with your vaccines. Tell your health care provider if: You often feel depressed. You have ever been abused or do not feel safe at home. Summary Adopting a healthy lifestyle and getting preventive care are important in promoting health and wellness. Follow your health care provider's instructions about healthy diet, exercising, and getting tested or screened for diseases. Follow your health care provider's instructions on monitoring your cholesterol and blood pressure. This information is not intended to replace advice given to you by your health care provider. Make sure you discuss any questions you have with your health care provider. Document Revised: 02/19/2021 Document Reviewed: 12/05/2018 Elsevier Patient Education  2022 Elsevier Inc.  

## 2021-09-09 LAB — CMP14+EGFR
ALT: 15 IU/L (ref 0–44)
AST: 21 IU/L (ref 0–40)
Albumin/Globulin Ratio: 0.8 — ABNORMAL LOW (ref 1.2–2.2)
Albumin: 3.5 g/dL — ABNORMAL LOW (ref 3.7–4.7)
Alkaline Phosphatase: 91 IU/L (ref 44–121)
BUN/Creatinine Ratio: 15 (ref 10–24)
BUN: 17 mg/dL (ref 8–27)
Bilirubin Total: 0.4 mg/dL (ref 0.0–1.2)
CO2: 26 mmol/L (ref 20–29)
Calcium: 9.6 mg/dL (ref 8.6–10.2)
Chloride: 104 mmol/L (ref 96–106)
Creatinine, Ser: 1.15 mg/dL (ref 0.76–1.27)
Globulin, Total: 4.2 g/dL (ref 1.5–4.5)
Glucose: 107 mg/dL — ABNORMAL HIGH (ref 65–99)
Potassium: 5 mmol/L (ref 3.5–5.2)
Sodium: 141 mmol/L (ref 134–144)
Total Protein: 7.7 g/dL (ref 6.0–8.5)
eGFR: 67 mL/min/1.73

## 2021-09-09 LAB — LIPID PANEL
Chol/HDL Ratio: 3.5 ratio (ref 0.0–5.0)
Cholesterol, Total: 130 mg/dL (ref 100–199)
HDL: 37 mg/dL — ABNORMAL LOW
LDL Chol Calc (NIH): 77 mg/dL (ref 0–99)
Triglycerides: 82 mg/dL (ref 0–149)
VLDL Cholesterol Cal: 16 mg/dL (ref 5–40)

## 2021-09-09 LAB — CBC
Hematocrit: 25.2 % — ABNORMAL LOW (ref 37.5–51.0)
Hemoglobin: 7.5 g/dL — ABNORMAL LOW (ref 13.0–17.7)
MCH: 23.5 pg — ABNORMAL LOW (ref 26.6–33.0)
MCHC: 29.8 g/dL — ABNORMAL LOW (ref 31.5–35.7)
MCV: 79 fL (ref 79–97)
Platelets: 356 x10E3/uL (ref 150–450)
RBC: 3.19 x10E6/uL — ABNORMAL LOW (ref 4.14–5.80)
RDW: 15.9 % — ABNORMAL HIGH (ref 11.6–15.4)
WBC: 8 x10E3/uL (ref 3.4–10.8)

## 2021-09-09 LAB — T PALLIDUM SCREENING CASCADE: T pallidum Antibodies (TP-PA): NONREACTIVE

## 2021-09-09 LAB — MICROALBUMIN / CREATININE URINE RATIO
Creatinine, Urine: 157.2 mg/dL
Microalb/Creat Ratio: 3 mg/g{creat} (ref 0–29)
Microalbumin, Urine: 5.5 ug/mL

## 2021-09-09 LAB — HEMOGLOBIN A1C
Est. average glucose Bld gHb Est-mCnc: 111 mg/dL
Hgb A1c MFr Bld: 5.5 % (ref 4.8–5.6)

## 2021-09-13 ENCOUNTER — Encounter: Payer: Self-pay | Admitting: Nurse Practitioner

## 2021-09-13 ENCOUNTER — Other Ambulatory Visit: Payer: Self-pay | Admitting: Nurse Practitioner

## 2021-09-30 ENCOUNTER — Ambulatory Visit (INDEPENDENT_AMBULATORY_CARE_PROVIDER_SITE_OTHER): Payer: Medicare HMO | Admitting: Nurse Practitioner

## 2021-09-30 ENCOUNTER — Other Ambulatory Visit: Payer: Self-pay

## 2021-09-30 ENCOUNTER — Encounter: Payer: Self-pay | Admitting: Nurse Practitioner

## 2021-09-30 VITALS — Temp 98.1°F | Ht 67.2 in | Wt 202.4 lb

## 2021-09-30 DIAGNOSIS — B888 Other specified infestations: Secondary | ICD-10-CM

## 2021-09-30 DIAGNOSIS — E782 Mixed hyperlipidemia: Secondary | ICD-10-CM | POA: Diagnosis not present

## 2021-09-30 DIAGNOSIS — R21 Rash and other nonspecific skin eruption: Secondary | ICD-10-CM | POA: Diagnosis not present

## 2021-09-30 DIAGNOSIS — R7303 Prediabetes: Secondary | ICD-10-CM

## 2021-09-30 DIAGNOSIS — M25559 Pain in unspecified hip: Secondary | ICD-10-CM | POA: Diagnosis not present

## 2021-09-30 DIAGNOSIS — Z2821 Immunization not carried out because of patient refusal: Secondary | ICD-10-CM | POA: Diagnosis not present

## 2021-09-30 DIAGNOSIS — I10 Essential (primary) hypertension: Secondary | ICD-10-CM

## 2021-09-30 DIAGNOSIS — W57XXXD Bitten or stung by nonvenomous insect and other nonvenomous arthropods, subsequent encounter: Secondary | ICD-10-CM | POA: Diagnosis not present

## 2021-09-30 MED ORDER — PREDNISONE 10 MG (21) PO TBPK
ORAL_TABLET | ORAL | 0 refills | Status: DC
Start: 2021-09-30 — End: 2022-03-23

## 2021-09-30 NOTE — Progress Notes (Signed)
I,Yamilka J Llittleton,acting as a Neurosurgeon for [employer], FNP.,have documented all relevant documentation on the behalf of Arnette Felts, FNP,as directed by  Arnette Felts, FNP while in the presence of Arnette Felts, FNP.   This visit occurred during the SARS-CoV-2 public health emergency.  Safety protocols were in place, including screening questions prior to the visit, additional usage of staff PPE, and extensive cleaning of exam room while observing appropriate contact time as indicated for disinfecting solutions.  Subjective:     Patient ID: Wesley Webb , male    DOB: 10/30/1946 , 75 y.o.   MRN: [id number]   Chief Complaint  Patient presents with   Hip Pain    HPI  He is here today for evaluation of his left hip pain. He was a walk in appt. Continues to also have a rash to his arms with itching  Hip Pain  The incident occurred 3 to 5 days ago. There was no injury mechanism. The pain is present in the left hip. The quality of the pain is described as aching. Pertinent negatives include no inability to bear weight or numbness. He reports no foreign bodies present. The symptoms are aggravated by weight bearing. He has tried acetaminophen for the symptoms.    Past Medical History:  Diagnosis Date   Brain aneurysm    Hypertension      Family History  Problem Relation Age of Onset   Glaucoma Mother    Hypertension Mother    Hypertension Father    Cancer Father    Alcohol abuse Father    Kidney disease Brother      Current Outpatient Medications:    COMBIGAN 0.2-0.5 % ophthalmic solution, , Disp: , Rfl:    diclofenac Sodium (VOLTAREN) 1 % GEL, Apply 2 g topically 4 (four) times daily., Disp: 100 g, Rfl: 2   hydrochlorothiazide (HYDRODIURIL) 25 MG tablet, Take 1 tablet by mouth once daily, Disp: 90 tablet, Rfl: 0   hydrocortisone cream 1 %, Apply to affected area 2 times daily, Disp: 30 g, Rfl: 1   ibuprofen (ADVIL) 800 MG tablet, TAKE 1 TABLET BY MOUTH EVERY 8 HOURS AS  NEEDED, Disp: 30 tablet, Rfl: 0   LUMIGAN 0.01 % SOLN, INSTILL 1 DROP INTO EACH EYE EVERY EVENING, Disp: , Rfl:    predniSONE (STERAPRED UNI-PAK 21 TAB) 10 MG (21) TBPK tablet, Take as directed, Disp: 21 tablet, Rfl: 0   No Known Allergies   Review of Systems  Constitutional: Negative.   Respiratory: Negative.    Cardiovascular: Negative.   Skin: Negative.   Neurological:  Negative for dizziness, numbness and headaches.  Psychiatric/Behavioral: Negative.      Today's Vitals   09/30/21 1000  Temp: 98.1 F (36.7 C)  Weight: 202 lb 6.4 oz (91.8 kg)  Height: 5' 7.2" (1.707 m)   Body mass index is 31.51 kg/m.  . Wt Readings from Last 3 Encounters:  09/30/21 202 lb 6.4 oz (91.8 kg)  09/08/21 203 lb 3.2 oz (92.2 kg)  04/15/21 213 lb 9.6 oz (96.9 kg)     Objective:  Physical Exam Vitals reviewed.  Constitutional:      General: He is not in acute distress. Pulmonary:     Effort: Pulmonary effort is normal. No respiratory distress.  Skin:    Findings: Rash (scaly rash appears inflammed) present.  Neurological:     General: No focal deficit present.     Mental Status: He is alert and oriented to person, place, and  time.     Cranial Nerves: No cranial nerve deficit.     Motor: No weakness.  Psychiatric:        Mood and Affect: Mood normal.        Behavior: Behavior normal.        Thought Content: Thought content normal.        Judgment: Judgment normal.        Assessment And Plan:     1. Hip pain Comments: Negative for radiculopathy, crepitus. Range of motion is normal Will treat with steroid taper He is to take tylenol twice a day aw well . - predniSONE (STERAPRED UNI-PAK 21 TAB) 10 MG (21) TBPK tablet; Take as directed  Dispense: 21 tablet; Refill: 0  2. Infestation by bed bug Patient had many bed bugs crawling on his clothes of various sizes, he also had a large amount of bed bugs on his coat. I explained to him again he needs to get his home treated and he does  not need to go out to different places as he is carrying them to other areas. He verbalizes that his wife is working on getting the home treated. I also explained to him he may need to get rid of items in the home including his clothes but he would need an exterminator to come and check the home. I also called his daughter [name] and informed her of the visit. She has mentioned they have had someone to check the home and indicated there was an infestation and it would be $3500 in which she mentions at this time they are working to try to get the money together. She did express the patient continues to go to different areas and have been notified he is coming there as well. I have also made her aware he would need to have his visit done virtual vs coming to the office until his home is completely clear of bed bugs to avoid spreading to the office. She verbalizes understanding. I have also referred him to CCM Nurse and SW.  - AMB Referral to [facility] Coordinaton  3. Bedbug bite, subsequent encounter - AMB Referral to [facility] Coordinaton  4. Rash and nonspecific skin eruption Comments: Has dry scaly skin with raised areas to his skin likely related to bed bug bites  5. Essential hypertension Comments: Referral to CCM for Nurse and Social Work for resources - AMB Referral to [facility] Coordin[facility]  6. Mixed hyperlipidemia Comments: Referral to CCM for Nurse and Social Work for resources - AMB Referral to [facility] Coordin[facility]  7. Prediabetes Comments: Referral to CCM for Nurse and Social Work for resources - AMB Referral to [facility] Coordinaton  8. Influenza vaccination declined  Patient declined influenza vaccination at this time. Patient is aware that influenza vaccine prevents illness in 70% of healthy people, and reduces hospitalizations to 30-70% in elderly. This vaccine is recommended annually. Pt is willing to accept risk associated with refusing  vaccination.  Vital signs not taken due to an abundance of bed bugs crawling on patient.   Patient was given opportunity to ask questions. Patient verbalized understanding of the plan and was able to repeat key elements of the plan. All questions were answered to their satisfaction.  Arnette Felts, FNP   I, Arnette Felts, FNP, have reviewed all documentation for this visit. The documentation on 09/30/21 for the exam, diagnosis, procedures, and orders are all accurate and complete.   IF YOU HAVE BEEN REFERRED TO A  SPECIALIST, IT MAY TAKE 1-2 WEEKS TO SCHEDULE/PROCESS THE REFERRAL. IF YOU HAVE NOT HEARD FROM US/SPECIALIST IN TWO WEEKS, PLEASE GIVE [place] A CALL AT [phone].   THE PATIENT IS ENCOURAGED TO PRACTICE SOCIAL DISTANCING DUE TO THE COVID-19 PANDEMIC.

## 2021-10-01 ENCOUNTER — Telehealth: Payer: Self-pay | Admitting: *Deleted

## 2021-10-01 NOTE — Chronic Care Management (AMB) (Signed)
  Chronic Care Management   Outreach Note  10/01/2021 Name: Ames Hoban MRN: [id number] DOB: May 08, 1946  Hikeem Andersson is a 75 y.o. year old male who is a primary care patient of Arnette Felts, FNP. I reached out to Wende Bushy by phone today in response to a referral sent by Mr. Hulda Humphrey Ambriz's primary care provider.  An unsuccessful telephone outreach was attempted today. The patient was referred to the case management team for assistance with care management and care coordination.   Follow Up Plan: A HIPAA compliant phone message was left for the patient providing contact information and requesting a return call. The care management team will reach out to the patient again over the next 7 days. If patient returns call to provider office, please advise to call Embedded Care Management Care Guide Misty Stanley at [phone].  Gwenevere Ghazi  Care Guide, Embedded Care Coordination [facility] Management  Direct Dial: [phone]

## 2021-10-04 NOTE — Chronic Care Management (AMB) (Signed)
  Chronic Care Management   Outreach Note  10/04/2021 Name: Yossef Gilkison MRN: [id number] DOB: 02/28/1946  Karlin Heilman is a 75 y.o. year old male who is a primary care patient of Arnette Felts, FNP. I reached out to Wende Bushy by phone today in response to a referral sent by Mr. Hulda Humphrey Barros's primary care provider.  A second unsuccessful telephone outreach was attempted today. The patient was referred to the case management team for assistance with care management and care coordination.   Follow Up Plan: A HIPAA compliant phone message was left for the patient providing contact information and requesting a return call. The care management team will reach out to the patient again over the next 7 days. If patient returns call to provider office, please advise to call Embedded Care Management Care Guide Misty Stanley at [phone].  Gwenevere Ghazi  Care Guide, Embedded Care Coordination [facility] Management  Direct Dial: [phone]

## 2021-10-05 ENCOUNTER — Ambulatory Visit (INDEPENDENT_AMBULATORY_CARE_PROVIDER_SITE_OTHER): Payer: Medicare HMO

## 2021-10-05 ENCOUNTER — Telehealth: Payer: Medicare HMO

## 2021-10-05 ENCOUNTER — Ambulatory Visit: Payer: Medicare HMO

## 2021-10-05 DIAGNOSIS — I10 Essential (primary) hypertension: Secondary | ICD-10-CM

## 2021-10-05 DIAGNOSIS — B888 Other specified infestations: Secondary | ICD-10-CM

## 2021-10-05 DIAGNOSIS — E782 Mixed hyperlipidemia: Secondary | ICD-10-CM

## 2021-10-05 DIAGNOSIS — R7303 Prediabetes: Secondary | ICD-10-CM

## 2021-10-05 DIAGNOSIS — W57XXXD Bitten or stung by nonvenomous insect and other nonvenomous arthropods, subsequent encounter: Secondary | ICD-10-CM

## 2021-10-05 NOTE — Chronic Care Management (AMB) (Addendum)
Chronic Care Management   CCM RN Visit Note  10/05/2021 Name: Wesley Webb MRN: [id number] DOB: 1946-05-16  Subjective: Wesley Webb is a 75 y.o. year old male who is a primary care patient of Minette Brine, [place]. The care management team was consulted for assistance with disease management and care coordination needs.    Collaboration with Daneen Schick BSW  for  Case Collaboration   in response to provider referral for case management and/or care coordination services.   Consent to Services:  The patient was given the following information about Chronic Care Management services today, agreed to services, and gave verbal consent: 1. CCM service includes personalized support from designated clinical staff supervised by the primary care provider, including individualized plan of care and coordination with other care providers 2. 24/7 contact phone numbers for assistance for urgent and routine care needs. 3. Service will only be billed when office clinical staff spend 20 minutes or more in a month to coordinate care. 4. Only one practitioner may furnish and bill the service in a calendar month. 5.The patient may stop CCM services at any time (effective at the end of the month) by phone call to the office staff. 6. The patient will be responsible for cost sharing (co-pay) of up to 20% of the service fee (after annual deductible is met). Patient agreed to services and consent obtained.  Patient agreed to services and verbal consent obtained.   Assessment: Review of patient past medical history, allergies, medications, health status, including review of consultants reports, laboratory and other test data, was performed as part of comprehensive evaluation and provision of chronic care management services.   SDOH (Social Determinants of Health) assessments and interventions performed:    CCM Care Plan  No Known Allergies  Outpatient Encounter Medications as of 10/05/2021  Medication Sig    COMBIGAN 0.2-0.5 % ophthalmic solution    diclofenac Sodium (VOLTAREN) 1 % GEL Apply 2 g topically 4 (four) times daily.   hydrochlorothiazide (HYDRODIURIL) 25 MG tablet Take 1 tablet by mouth once daily   hydrocortisone cream 1 % Apply to affected area 2 times daily   ibuprofen (ADVIL) 800 MG tablet TAKE 1 TABLET BY MOUTH EVERY 8 HOURS AS NEEDED   LUMIGAN 0.01 % SOLN INSTILL 1 DROP INTO EACH EYE EVERY EVENING   predniSONE (STERAPRED UNI-PAK 21 TAB) 10 MG (21) TBPK tablet Take as directed   [DISCONTINUED] amLODipine (NORVASC) 10 MG tablet Take 1 tablet by mouth once daily   No facility-administered encounter medications on file as of 10/05/2021.    Patient Active Problem List   Diagnosis Date Noted   Urinary urgency 05/30/2019   Mixed hyperlipidemia 09/17/2018   Essential hypertension 09/17/2018    Conditions to be addressed/monitored: Infestation by bed bug, Bedbug bite, subsequent encounter, Essential hypertension, Mixed hyperlipidemia, Prediabetes  Care Plan : Assist with Chronic Care Management and Care Coordination needs  Updates made by Lynne Logan, RN since 10/05/2021 12:00 AM     Problem: Assist with Chronic Care Management and Care Coordination needs   Priority: High     Goal: Assist with Chronic Care Management and Care Coordination needs   Start Date: 10/05/2021  Expected End Date: 11/24/2021  Priority: High  Note:   Current Barriers:  Chronic Disease Management support, education, chronic care management and care coordination needs related to Infestation by bed bug, Bedbug bite, subsequent encounter, Essential hypertension, Mixed hyperlipidemia, Prediabetes Case Manager Clinical Goal(s):  Patient will work with the CCM  team to address needs related to chronic care management and care coordination needs related to Infestation by bed bug, Bedbug bite, subsequent encounter, Essential hypertension, Mixed hyperlipidemia, Prediabetes  Interventions:  Collaborated  with BSW to initiate plan of care to address needs related to chronic care management and care coordination needs related to Infestation by bed bug, Bedbug bite, subsequent encounter, Essential hypertension, Mixed hyperlipidemia, Prediabetes  Collaboration with Minette Brine FNP regarding development and update of comprehensive plan of care as evidenced by provider attestation and co-signature Inter-disciplinary care team collaboration (see longitudinal plan of care) Patient Goals/Self-Care Activities patient will:   - Patient will work with the CCM team to address chronic care management and care coordination needs and will continue to work with the clinical team to address health care and disease management related needs.    Follow Up Plan: The care management team will reach out to the patient again over the next 30-45 days.        Plan:Telephone follow up appointment with care management team member scheduled for:  10/20/21  Barb Merino, RN, BSN, CCM Care Management Coordinator [place] [facility]  Direct Phone: [phone]

## 2021-10-05 NOTE — Patient Instructions (Signed)
Social Worker Visit Information  Goals we discussed today:   Goals Addressed             This Visit's Progress    Barriers to Treatment Identified and Managed       Timeframe:  Long-Range Goal Priority:  High Start Date:   10.11.22                                           Next planned outreach: 10.20.22  Patient Goals/Self-Care Activities patient will: with the help of his family  - Engage with APS as needed to complete a home visit -Work with SW to explore options to treat home for bed bugs -Engage with RN Care Manager to develop an individualized plan of care         Materials provided: Verbal education about bed bug resources provided by phone  Wesley Webb was given information about Chronic Care Management services today including:  CCM service includes personalized support from designated clinical staff supervised by his physician, including individualized plan of care and coordination with other care providers 24/7 contact phone numbers for assistance for urgent and routine care needs. Service will only be billed when office clinical staff spend 20 minutes or more in a month to coordinate care. Only one practitioner may furnish and bill the service in a calendar month. The patient may stop CCM services at any time (effective at the end of the month) by phone call to the office staff. The patient will be responsible for cost sharing (co-pay) of up to 20% of the service fee (after annual deductible is met).  Patient agreed to services and verbal consent obtained.   Patient verbalizes understanding of instructions provided today and agrees to view in MyChart.   Follow up plan: SW will follow up with patient by phone over the next 15 days  Kendra Humble, BSW, CDP Social Worker, Certified Dementia Practitioner [facility] Care Management [phone]      

## 2021-10-05 NOTE — Chronic Care Management (AMB) (Signed)
Chronic Care Management    Social Work Note  10/05/2021 Name: Wesley Webb MRN: [id number] DOB: 1946-11-20  Wesley Webb is a 75 y.o. year old male who is a primary care patient of Wesley Webb, [place]. The CCM team was consulted to assist the patient with chronic disease management and/or care coordination needs related to:  Infestation of bed bugs, bed bug bite, HTN, Mixed Hyperlipidemia, and Prediabetes .   Engaged with patients daughter Wesley Webb by phone  for initial visit in response to provider referral for social work chronic care management and care coordination services.   Consent to Services:  The patient was given the following information about Chronic Care Management services today, agreed to services, and gave verbal consent: 1. CCM service includes personalized support from designated clinical staff supervised by the primary care provider, including individualized plan of care and coordination with other care providers 2. 24/7 contact phone numbers for assistance for urgent and routine care needs. 3. Service will only be billed when office clinical staff spend 20 minutes or more in a month to coordinate care. 4. Only one practitioner may furnish and bill the service in a calendar month. 5.The patient may stop CCM services at any time (effective at the end of the month) by phone call to the office staff. 6. The patient will be responsible for cost sharing (co-pay) of up to 20% of the service fee (after annual deductible is met). Patient agreed to services and consent obtained.  Patient agreed to services and consent obtained.   Assessment: Review of patient past medical history, allergies, medications, and health status, including review of relevant consultants reports was performed today as part of a comprehensive evaluation and provision of chronic care management and care coordination services.     SDOH (Social Determinants of Health) assessments and interventions performed:   SDOH Interventions    Flowsheet Row Most Recent Value  SDOH Interventions   Food Insecurity Interventions Intervention Not Indicated  Housing Interventions Other (Comment)  [APS Referral]  Transportation Interventions Intervention Not Indicated        Advanced Directives Status: Not addressed in this encounter.  CCM Care Plan  No Known Allergies  Outpatient Encounter Medications as of 10/05/2021  Medication Sig   COMBIGAN 0.2-0.5 % ophthalmic solution    diclofenac Sodium (VOLTAREN) 1 % GEL Apply 2 g topically 4 (four) times daily.   hydrochlorothiazide (HYDRODIURIL) 25 MG tablet Take 1 tablet by mouth once daily   hydrocortisone cream 1 % Apply to affected area 2 times daily   ibuprofen (ADVIL) 800 MG tablet TAKE 1 TABLET BY MOUTH EVERY 8 HOURS AS NEEDED   LUMIGAN 0.01 % SOLN INSTILL 1 DROP INTO EACH EYE EVERY EVENING   predniSONE (STERAPRED UNI-PAK 21 TAB) 10 MG (21) TBPK tablet Take as directed   [DISCONTINUED] amLODipine (NORVASC) 10 MG tablet Take 1 tablet by mouth once daily   No facility-administered encounter medications on file as of 10/05/2021.    Patient Active Problem List   Diagnosis Date Noted   Urinary urgency 05/30/2019   Mixed hyperlipidemia 09/17/2018   Essential hypertension 09/17/2018    Conditions to be addressed/monitored:  HTN, Mixed Hyperlipidemia, and Prediabetes ;  Bed Bug Infestation  Care Plan : Social Work [facility] Care Plan  Updates made by Wesley Webb since 10/05/2021 12:00 AM     Problem: Barriers to Treatment      Long-Range Goal: Barriers to Treatment Identified and Managed   Start Date: 10/05/2021  Priority:  High  Note:   Current Barriers:  Chronic disease management support and education needs related to  HTN, Mixed Hyperlipidemia, Prediabetes   Bed bug infestation   Cognitive decline Weight loss of 10 pounds or more in last 6 months  Social Worker Clinical Goal(s):  patient will work with SW to identify and address any  acute and/or chronic care coordination needs related to the self health management of  HTN, Mixed Hyperlipidemia, and Prediabetes   patient will work with SW to address concerns related to bed bug infestation Patient will engage with RN Care Manager to develop an individualized plan of care to address disease management needs  SW Interventions:  Inter-disciplinary care team collaboration (see longitudinal plan of care) Collaboration with Wesley Brine, FNP regarding development and update of comprehensive plan of care as evidenced by provider attestation and co-signature Successful outbound call placed to the patients daughter Wesley Webb to complete an initial assessment Discussed the patient was recently referred to the care management team due to concern that patient has arrived to recent office visits with bed bugs visibly crawling on him Wesley Webb reports the patient lives in a two story home with his wife - patient resides mainly upstairs while his wife remains in the downstairs. It is thought the patient has knowingly lived with bed bugs upstairs for 1.5-2 years Discussed the patient has bugs crawling on his person constantly and is knowingly wearing clothing that is infested with bugs Wesley Webb reports [employer] came to the home to do a home inspection in the past and reported the bed bugs were only in the upstairs of the home It is reported treatment was not arranged due to concern the patient is a hoarder and the family was concerned it would not be successful. 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 Patient has been digging items out of the trash to return to the inside of the home Determined the family is concerned with patients cognition at this time as he is no longer taking care of his hygiene and is wearing dirty clothes which is not like him Performed chart review to note 10 pound weight loss over the last 6 months Discussed concern patient may need to be seen by Neurology and barrier to treatment due to bed bug infestation - Wesley Webb agreed Daneil Dan there are not many resources to assist with bed bug eradication but SW would reach out to a few resources to attempt to find assistance Discussed follow up with SW while patient engages with  RN Case Manager  to address care management needs Successful outbound call placed to the [facility] who does not assist with bed bug eradication - SW advised to contact [employer]  Call placed to [facility] - voice message left with Gerlene Fee requesting a return call Collaboration with [employer] for case collaboration - discussed SW may need to place an APS report to determine if patient is experiencing neglect due to noted concern during September office visit notes from primary care provider regarding changes in cognition Successful outbound call placed to the patients daughter to advise SW will plan to place an APS report to determine if this is a resource to assist with patient care needs APS report placed to [facility] who will notify SW if case is accepted by SW supervisor Received communication from [employer] with APS indicating the patient case has been accepted and they will evaluate  the need for protection based on SW report Collaboration with Wesley Brine FNP and [place] advising of interventions and plan  Patient Goals/Self-Care Activities patient will: with the help of his family  -  Engage with APS as needed to complete a home visit -Work with SW to explore options to treat home for bed bugs -Engage with Consulting civil engineer to develop an individualized plan of care  Follow Up Plan:  SW will continue to follow       Follow Up Plan: SW will follow up with patient by phone over the next 15 days      Wesley Webb, BSW, CDP Social Worker, Certified Dementia Practitioner [place] [facility] [place] [facility] [phone]

## 2021-10-05 NOTE — Chronic Care Management (AMB) (Signed)
  Chronic Care Management   Note  10/05/2021 Name: Wesley Webb MRN: [id number] DOB: 09/15/1946  Wesley Webb is a 75 y.o. year old male who is a primary care patient of Minette Brine, [place]. I reached out to York Ram by phone today in response to a referral sent by Wesley Webb's PCP.  Wesley Webb was given information about Chronic Care Management services today including:  CCM service includes personalized support from designated clinical staff supervised by his physician, including individualized plan of care and coordination with other care providers 24/7 contact phone numbers for assistance for urgent and routine care needs. Service will only be billed when office clinical staff spend 20 minutes or more in a month to coordinate care. Only one practitioner may furnish and bill the service in a calendar month. The patient may stop CCM services at any time (effective at the end of the month) by phone call to the office staff. The patient is responsible for co-pay (up to 20% after annual deductible is met) if co-pay is required by the individual health plan.   Daughter Wesley Webb, Wesley Webb DPR on file  verbally agreed to assistance and services provided by embedded care coordination/care management team today.  Follow up plan: Telephone appointment with care management team member scheduled for:10/05/21  [place] [facility]  Direct Dial: [phone]

## 2021-10-13 ENCOUNTER — Ambulatory Visit: Payer: Medicare HMO

## 2021-10-13 ENCOUNTER — Encounter (HOSPITAL_COMMUNITY): Payer: Self-pay

## 2021-10-13 ENCOUNTER — Other Ambulatory Visit: Payer: Self-pay | Admitting: Nurse Practitioner

## 2021-10-13 DIAGNOSIS — B888 Other specified infestations: Secondary | ICD-10-CM

## 2021-10-13 DIAGNOSIS — E782 Mixed hyperlipidemia: Secondary | ICD-10-CM

## 2021-10-13 DIAGNOSIS — W57XXXD Bitten or stung by nonvenomous insect and other nonvenomous arthropods, subsequent encounter: Secondary | ICD-10-CM

## 2021-10-13 DIAGNOSIS — R413 Other amnesia: Secondary | ICD-10-CM

## 2021-10-13 DIAGNOSIS — I10 Essential (primary) hypertension: Secondary | ICD-10-CM

## 2021-10-13 NOTE — Patient Instructions (Signed)
Social Worker Visit Information  Goals we discussed today:   Goals Addressed             This Visit's Progress    Barriers to Treatment Identified and Managed       Timeframe:  Long-Range Goal Priority:  High Start Date:   10.11.22                                           Next planned outreach: 11.2.22  Patient Goals/Self-Care Activities patient will: with the help of his family  - Engage with APS as needed to complete a home visit -Work with SW to explore options to treat home for bed bugs -Engage with Medical illustrator to develop an individualized plan of care         Materials Provided: Verbal education about guardianship process provided by phone  Patient verbalizes understanding of instructions provided today and agrees to view in [place].   Follow Up Plan: SW will follow up with patient by phone over the next 21 days  Bevelyn Ngo, BSW, CDP Social Worker, Certified Dementia Practitioner [facility] Care Management [phone]

## 2021-10-13 NOTE — Chronic Care Management (AMB) (Signed)
Chronic Care Management    Social Work Note  10/13/2021 Name: Wesley Webb MRN: [id number] DOB: 02/25/1946  Wesley Webb is a 75 y.o. year old male who is a primary care patient of Wesley Felts, FNP. The CCM team was consulted to assist the patient with chronic disease management and/or care coordination needs related to:  Infestation of Bed Bugs, Bed Bug Bite, HTN, Mixed Hyperlipidemia, Prediabetes .   Engaged with daughter Wesley Webb by phone  for follow up visit in response to provider referral for social work chronic care management and care coordination services.   Consent to Services:  The patient was given information about Chronic Care Management services, agreed to services, and gave verbal consent prior to initiation of services.  Please see initial visit note for detailed documentation.   Patient agreed to services and consent obtained.   Assessment: Review of patient past medical history, allergies, medications, and health status, including review of relevant consultants reports was performed today as part of a comprehensive evaluation and provision of chronic care management and care coordination services.     SDOH (Social Determinants of Health) assessments and interventions performed:    Advanced Directives Status: Not addressed in this encounter.  CCM Care Plan  No Known Allergies  Outpatient Encounter Medications as of 10/13/2021  Medication Sig   COMBIGAN 0.2-0.5 % ophthalmic solution    diclofenac Sodium (VOLTAREN) 1 % GEL Apply 2 g topically 4 (four) times daily.   hydrochlorothiazide (HYDRODIURIL) 25 MG tablet Take 1 tablet by mouth once daily   hydrocortisone cream 1 % Apply to affected area 2 times daily   ibuprofen (ADVIL) 800 MG tablet TAKE 1 TABLET BY MOUTH EVERY 8 HOURS AS NEEDED   LUMIGAN 0.01 % SOLN INSTILL 1 DROP INTO EACH EYE EVERY EVENING   predniSONE (STERAPRED UNI-PAK 21 TAB) 10 MG (21) TBPK tablet Take as directed   [DISCONTINUED] amLODipine  (NORVASC) 10 MG tablet Take 1 tablet by mouth once daily   No facility-administered encounter medications on file as of 10/13/2021.    Patient Active Problem List   Diagnosis Date Noted   Urinary urgency 05/30/2019   Mixed hyperlipidemia 09/17/2018   Essential hypertension 09/17/2018    Conditions to be addressed/monitored:  Infestation of Bed Bugs, Bed Bug Bite, HTN, Mixed Hyperlipidemia, Prediabetes ; Cognitive Deficits  Care Plan : Social Work [facility] Care Plan  Updates made by Bevelyn Ngo since 10/13/2021 12:00 AM     Problem: Barriers to Treatment      Long-Range Goal: Barriers to Treatment Identified and Managed   Start Date: 10/05/2021  Priority: High  Note:   Current Barriers:  Chronic disease management support and education needs related to  HTN, Mixed Hyperlipidemia, Prediabetes   Bed bug infestation   Cognitive decline Weight loss of 10 pounds or more in last 6 months  Social Worker Clinical Goal(s):  patient will work with SW to identify and address any acute and/or chronic care coordination needs related to the self health management of  HTN, Mixed Hyperlipidemia, and Prediabetes   patient will work with SW to address concerns related to bed bug infestation Patient will engage with RN Care Manager to develop an individualized plan of care to address disease management needs  SW Interventions:  Inter-disciplinary care team collaboration (see longitudinal plan of care) Collaboration with Wesley Felts, FNP regarding development and update of comprehensive plan of care as evidenced by provider attestation and co-signature Successful outbound call placed to the patients daughter  Wesley Webb to assess goal progression Discussed APS did attempt to make a home visit but the patient was not home so they did not stay Reviewed family concern with level of bed bug infestation in the patients home - Wesley Webb reports the upstairs is infested noting bugs can be seen on the floors,  walls, furniture, and clothing Determined the patient is also spending time each day sharing a vehicle with his brother who is also a Chartered loss adjuster and is known to look through dumpster for items - the family feels bed bugs are in the vehicle the patient shares with his brother as well as places they both visit together Discussed the family is able to pull resources to treat the home but is concerned it would not be effective due to behaviors listed above and large possibility the bed bugs would return due to patients daily routines with his brother Wesley Webb reports she and her family are very concerned with the patients cognition at this time considering the infestation has led to skin infections and rashes and the patients reluctance to treat or acknowledge there is a concern. She does report due to his Glaucoma he can not see them on himself Discussed opportunity to refer the patient to psychiatry to work with the patient to address hoarding concerns - Wesley Webb reports she feels the patient may not participate  Discussed opportunity to refer to Neurology due to concerns with decline in cognition - advised Wesley Webb there is a chance his referral will be held until treatment has been completed Determined the patients family is very concerned with how to address in home infestation while managing patients hoarding and cognitive decline Encouraged Wesley Webb to reach out to APS for an update on patients referral to see if a second home visit is planned Provided verbal education on the process to apply for guardianship which if granted will allow Wesley Webb to help manage patient finances and medical needs Discussed plan for SW to communicate with patients primary provider to review above concerns and discuss if a referral to neurology is appropriate to further assess memory changes Collaboration with Wesley Felts FNP to discuss concerns with patients cognition - referral placed to neurology Collaboration with RN Care Manager Wesley Webb  Little to update on interventions and plan  Patient Goals/Self-Care Activities patient will: with the help of his family  -  Engage with APS as needed to complete a home visit -Work with SW to explore options to treat home for bed bugs -Engage with RN Care Manager to develop an individualized plan of care  Follow Up Plan:  SW will continue to follow       Follow Up Plan: SW will follow up with patient by phone over the next 21 days      Bevelyn Ngo, Kenard Gower, CDP Social Worker, Certified Dementia Practitioner [facility] Care Management [phone]

## 2021-10-14 ENCOUNTER — Telehealth: Payer: Medicare HMO

## 2021-10-20 ENCOUNTER — Telehealth: Payer: Medicare HMO

## 2021-10-20 ENCOUNTER — Telehealth: Payer: Self-pay

## 2021-10-20 ENCOUNTER — Ambulatory Visit: Payer: Self-pay

## 2021-10-20 DIAGNOSIS — E782 Mixed hyperlipidemia: Secondary | ICD-10-CM

## 2021-10-20 DIAGNOSIS — B888 Other specified infestations: Secondary | ICD-10-CM

## 2021-10-20 DIAGNOSIS — R413 Other amnesia: Secondary | ICD-10-CM

## 2021-10-20 DIAGNOSIS — I10 Essential (primary) hypertension: Secondary | ICD-10-CM

## 2021-10-20 NOTE — Telephone Encounter (Signed)
  Care Management   Follow Up Note   10/20/2021 Name: Wesley Webb MRN: [id number] DOB: 1946-07-27   Referred by: Arnette Felts, FNP Reason for referral : Chronic Care Management (Initial RN CM Outreach )   An unsuccessful telephone outreach was attempted today. The patient was referred to the case management team for assistance with care management and care coordination.   Follow Up Plan: A HIPPA compliant phone message was left for the patient providing contact information and requesting a return call.   Delsa Sale, RN, BSN, CCM Care Management Coordinator [facility] Care Management/[facility]  Direct Phone: [phone]

## 2021-10-21 NOTE — Chronic Care Management (AMB) (Signed)
Chronic Care Management   CCM RN Visit Note  10/20/2021 Name: Wesley Webb MRN: [id number] DOB: [birth year]  Subjective: Wesley Webb is a 75 y.o. year old male who is a primary care patient of Arnette Felts, FNP. The care management team was consulted for assistance with disease management and care coordination needs.    Engaged with patient by telephone for initial visit in response to provider referral for case management and/or care coordination services.   Consent to Services:  The patient was given information about Chronic Care Management services, agreed to services, and gave verbal consent prior to initiation of services.  Please see initial visit note for detailed documentation.   Patient agreed to services and verbal consent obtained.   Assessment: Review of patient past medical history, allergies, medications, health status, including review of consultants reports, laboratory and other test data, was performed as part of comprehensive evaluation and provision of chronic care management services.   SDOH (Social Determinants of Health) assessments and interventions performed:    CCM Care Plan  No Known Allergies  Outpatient Encounter Medications as of 10/20/2021  Medication Sig   COMBIGAN 0.2-0.5 % ophthalmic solution    diclofenac Sodium (VOLTAREN) 1 % GEL Apply 2 g topically 4 (four) times daily.   hydrochlorothiazide (HYDRODIURIL) 25 MG tablet Take 1 tablet by mouth once daily   hydrocortisone cream 1 % Apply to affected area 2 times daily   ibuprofen (ADVIL) 800 MG tablet TAKE 1 TABLET BY MOUTH EVERY 8 HOURS AS NEEDED   LUMIGAN 0.01 % SOLN INSTILL 1 DROP INTO EACH EYE EVERY EVENING   predniSONE (STERAPRED UNI-PAK 21 TAB) 10 MG (21) TBPK tablet Take as directed   No facility-administered encounter medications on file as of 10/20/2021.    Patient Active Problem List   Diagnosis Date Noted   Urinary urgency 05/30/2019   Mixed hyperlipidemia 09/17/2018    Essential hypertension 09/17/2018    Conditions to be addressed/monitored: Infestation by bed bug, Bedbug bite, subsequent encounter, Essential hypertension, Mixed hyperlipidemia, Prediabetes, Memory changes   Care Plan : RN Care Manager Plan of Care  Updates made by Riley Churches, RN since 10/20/2021 12:00 AM     Problem: No plan of care established for management of Chronic disease states for Infestation by bed bug, Bedbug bite, subsequent encounter, Essential hypertension, Mixed hyperlipidemia, Prediabetes, Memory changes   Priority: High     Long-Range Goal: Development of plan of care for chronic disease management for Infestation by bed bug, Bedbug bite, subsequent encounter, Essential hypertension, Mixed hyperlipidemia, Prediabetes, Memory changes   Start Date: 10/20/2021  Expected End Date: 10/20/2022  This Visit's Progress: On track  Priority: High  Note:   Current Barriers:  Knowledge Deficits related to plan of care for management of Infestation by bed bug, Bedbug bite, subsequent encounter, Essential hypertension, Mixed hyperlipidemia, Prediabetes, Memory changes Chronic Disease Management support and education needs related to Infestation by bed bug, Bedbug bite, subsequent encounter, Essential hypertension, Mixed hyperlipidemia, Prediabetes, Memory changes Compulsive Hoarding   RNCM Clinical Goal(s):  Patient will verbalize understanding of plan for management of Infestation by bed bug, Bedbug bite, subsequent encounter, Essential hypertension, Mixed hyperlipidemia, Prediabetes, Memory changes take all medications exactly as prescribed and will call provider for medication related questions attend all scheduled medical appointments: [facility], Dr. Teresa Coombs scheduled for 11/09/21 @10 :45 AM demonstrate Improved health management independence   continue to work with RN Care Manager to address care management and care coordination needs related to  Infestation by bed bug, Bedbug  bite, subsequent encounter, Essential hypertension, Mixed hyperlipidemia, Prediabetes, Memory changes work with Child psychotherapist to address  related to the management of bed bug infestation, APS referral  related to the management of Infestation by bed bug, Bedbug bite, subsequent encounter, Essential hypertension, Mixed hyperlipidemia, Prediabetes, Memory changes will demonstrate ongoing self health care management ability    through collaboration with RN Care manager, provider, and care team.   Interventions: 1:1 collaboration with primary care provider regarding development and update of comprehensive plan of care as evidenced by provider attestation and co-signature Inter-disciplinary care team collaboration (see longitudinal plan of care) Evaluation of current treatment plan related to  self management and patient's adherence to plan as established by provider  New goal. Evaluation of current treatment plan related to  Cognitive decline with impaired judgement, Compulsive Hoarding, bed bug infestation , self-management and patient's adherence to plan as established by provider. 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 Proch work through these issues Discussed cognitive changes with patient and daughter [name] feels his judgement is impaired, she believes this  may be contributing to his lack of motivation to exterminate for the bed bugs  Reviewed scheduled/upcoming provider appointments including: [facility], Dr. San Morelle scheduled for 11/09/21 @10 :45 AM for evaluation of memory changes   New goal. Evaluation of current treatment plan related to  Abnormal Hemoglobin and Hematrocrit , self-management and patient's adherence to plan as established by provider. Discussed plans with patient for ongoing care management follow up and provided patient with direct contact information for care management team Collaborated with daughter Carney Saxton regarding patient's abnormally low Hemoglobin and Hematocrit; Educated on potential causes for this to occur, daughter is unsure if patient is experiencing any symptoms suggestive of GI bleed; Informed daughter Elease Hashimoto, PCP sent a GI referral to [facility] for further evaluation, advised [facility] was unable to reach Mr. Busche to schedule an appointment; Provided [facility] with the contact number and address for this provider in order to get patient scheduled for evaluation of the low H/H; Educated on signs/symptoms suggestive of GI bleed and when to call the doctor and or 911; Discussed plans with patient for ongoing care management follow up and provided patient with direct contact information for care management team;   Patient Goals/Self-Care Activities: Patient will self administer medications as prescribed Patient will attend all scheduled provider appointments Patient will call pharmacy for medication refills Patient will continue to perform ADL's independently Patient will continue to perform IADL's independently Patient will call provider office for new concerns or questions Patient will work with BSW to address care coordination needs and will continue to work with the clinical team to address health care and disease management related needs.    Follow Up Plan:  Telephone follow up appointment with care management team  member scheduled for:  11/10/21     Plan:Telephone follow up appointment with care management team member scheduled for:  11/10/21  Delsa Sale, RN, BSN, CCM Care Management Coordinator [facility] Care Management/[facility]  Direct Phone: [phone]

## 2021-10-21 NOTE — Patient Instructions (Signed)
Visit Information   Consent to CCM Services: Wesley Webb was given information about Chronic Care Management services including:  CCM service includes personalized support from designated clinical staff supervised by his physician, including individualized plan of care and coordination with other care providers 24/7 contact phone numbers for assistance for urgent and routine care needs. Service will only be billed when office clinical staff spend 20 minutes or more in a month to coordinate care. Only one practitioner may furnish and bill the service in a calendar month. The patient may stop CCM services at any time (effective at the end of the month) by phone call to the office staff. The patient will be responsible for cost sharing (co-pay) of up to 20% of the service fee (after annual deductible is met).  Patient agreed to services and verbal consent obtained.   The patient verbalized understanding of instructions, educational materials, and care plan provided today and declined offer to receive copy of patient instructions, educational materials, and care plan.   Telephone follow up appointment with care management team member scheduled for: 11/10/21  Wesley Merino, RN, BSN, CCM Care Management Coordinator [facility]Wesley Webb  Direct Phone: [phone]    CLINICAL CARE PLAN:  Patient Care Plan: RN Care Manager Plan of Care     Problem Identified: No plan of care established for management of Chronic disease states for Infestation by bed bug, Bedbug bite, subsequent encounter, Essential hypertension, Mixed hyperlipidemia, Prediabetes, Memory changes   Priority: High     Long-Range Goal: Development of plan of care for chronic disease management for Infestation by bed bug, Bedbug bite, subsequent encounter, Essential hypertension, Mixed hyperlipidemia, Prediabetes, Memory changes   Start Date: 10/20/2021  Expected End Date: 10/20/2022  This Visit's  Progress: On track  Priority: High  Note:   Current Barriers:  Knowledge Deficits related to plan of care for management of Infestation by bed bug, Bedbug bite, subsequent encounter, Essential hypertension, Mixed hyperlipidemia, Prediabetes, Memory changes Chronic Disease Management support and education needs related to Infestation by bed bug, Bedbug bite, subsequent encounter, Essential hypertension, Mixed hyperlipidemia, Prediabetes, Memory changes Compulsive Hoarding   RNCM Clinical Goal(s):  Patient will verbalize understanding of plan for management of Infestation by bed bug, Bedbug bite, subsequent encounter, Essential hypertension, Mixed hyperlipidemia, Prediabetes, Memory changes take all medications exactly as prescribed and will call provider for medication related questions attend all scheduled medical appointments: Wesley Webb, Wesley Webb scheduled for 11/09/21 _0 :68 AM demonstrate Improved health management independence   continue to work with RN Care Manager to address care management and care coordination needs related to  Infestation by bed bug, Bedbug bite, subsequent encounter, Essential hypertension, Mixed hyperlipidemia, Prediabetes, Memory changes work with Education officer, museum to address  related to the management of bed bug infestation, APS referral  related to the management of Infestation by bed bug, Bedbug bite, subsequent encounter, Essential hypertension, Mixed hyperlipidemia, Prediabetes, Memory changes will demonstrate ongoing self health care management ability    through collaboration with RN Care manager, provider, and care team.   Interventions: 1:1 collaboration with primary care provider regarding development and update of comprehensive plan of care as evidenced by provider attestation and co-signature Inter-disciplinary care team collaboration (see longitudinal plan of care) Evaluation of current treatment plan related to  self management and patient's adherence to plan as  established by provider  New goal. Evaluation of current treatment plan related to  Cognitive decline with impaired judgement, Compulsive Hoarding, bed bug infestation , self-management  and patient's adherence to plan as established by provider. 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 Evaluation of current treatment plan related to  Abnormal Hemoglobin and Hematrocrit , self-management and patient's adherence to plan as established by provider. Discussed plans with patient for ongoing care management follow up and provided patient with direct contact information for care management team Collaborated with daughter Wesley Webb regarding patient's abnormally low Hemoglobin and Hematocrit; Educated on potential causes for this to occur, daughter is unsure if patient is experiencing any symptoms suggestive of Webb bleed; Informed daughter Wesley Webb, PCP sent a Webb referral to Wesley Webb for further  evaluation, advised Wesley Webb Webb was unable to reach Mr. Perales to schedule an appointment; Provided [facility] with the contact number and address for this provider in order to get patient scheduled for evaluation of the low H/H; Educated on signs/symptoms suggestive of Webb bleed and when to call the doctor and or 911; Discussed plans with patient for ongoing care management follow up and provided patient with direct contact information for care management team;   Patient Goals/Self-Care Activities: Patient will self administer medications as prescribed Patient will attend all scheduled provider appointments Patient will call pharmacy for medication refills Patient will continue to perform ADL's independently Patient will continue to perform IADL's independently Patient will call provider office for new concerns or questions Patient will work with BSW to address care coordination needs and will continue to work with the clinical team to address health care and disease management related needs.    Follow Up Plan:  Telephone follow up appointment with care management team member scheduled for:  11/10/21

## 2021-10-25 ENCOUNTER — Ambulatory Visit: Payer: Medicare HMO | Admitting: Physician Assistant

## 2021-10-25 ENCOUNTER — Encounter: Payer: Self-pay | Admitting: Physician Assistant

## 2021-10-25 ENCOUNTER — Other Ambulatory Visit (INDEPENDENT_AMBULATORY_CARE_PROVIDER_SITE_OTHER): Payer: Medicare HMO

## 2021-10-25 VITALS — BP 140/70 | HR 76 | Ht 67.0 in | Wt 195.2 lb

## 2021-10-25 DIAGNOSIS — D509 Iron deficiency anemia, unspecified: Secondary | ICD-10-CM

## 2021-10-25 DIAGNOSIS — I10 Essential (primary) hypertension: Secondary | ICD-10-CM

## 2021-10-25 DIAGNOSIS — E782 Mixed hyperlipidemia: Secondary | ICD-10-CM | POA: Diagnosis not present

## 2021-10-25 LAB — CBC WITH DIFFERENTIAL/PLATELET
Basophils Absolute: 0 10*3/uL (ref 0.0–0.1)
Basophils Relative: 0.5 % (ref 0.0–3.0)
Eosinophils Absolute: 1.6 10*3/uL — ABNORMAL HIGH (ref 0.0–0.7)
Eosinophils Relative: 20.4 % — ABNORMAL HIGH (ref 0.0–5.0)
HCT: 30.1 % — ABNORMAL LOW (ref 39.0–52.0)
Hemoglobin: 9 g/dL — ABNORMAL LOW (ref 13.0–17.0)
Lymphocytes Relative: 20.7 % (ref 12.0–46.0)
Lymphs Abs: 1.6 10*3/uL (ref 0.7–4.0)
MCHC: 30 g/dL (ref 30.0–36.0)
MCV: 70.1 fl — ABNORMAL LOW (ref 78.0–100.0)
Monocytes Absolute: 0.6 10*3/uL (ref 0.1–1.0)
Monocytes Relative: 8 % (ref 3.0–12.0)
Neutro Abs: 3.9 10*3/uL (ref 1.4–7.7)
Neutrophils Relative %: 50.4 % (ref 43.0–77.0)
Platelets: 317 10*3/uL (ref 150.0–400.0)
RBC: 4.29 Mil/uL (ref 4.22–5.81)
RDW: 19.8 % — ABNORMAL HIGH (ref 11.5–15.5)
WBC: 7.7 10*3/uL (ref 4.0–10.5)

## 2021-10-25 LAB — IBC PANEL
Iron: 119 ug/dL (ref 42–165)
Saturation Ratios: 26.9 % (ref 20.0–50.0)
TIBC: 442.4 ug/dL (ref 250.0–450.0)
Transferrin: 316 mg/dL (ref 212.0–360.0)

## 2021-10-25 LAB — FERRITIN: Ferritin: 11.3 ng/mL — ABNORMAL LOW (ref 22.0–322.0)

## 2021-10-25 MED ORDER — NA SULFATE-K SULFATE-MG SULF 17.5-3.13-1.6 GM/177ML PO SOLN: 1.0000 | Freq: Once | ORAL | 0 refills | Status: AC

## 2021-10-25 NOTE — Patient Instructions (Signed)
If you are age 74 or older, your body mass index should be between 23-30. Your Body mass index is 30.57 kg/m. If this is out of the aforementioned range listed, please consider follow up with your Primary Care Provider. ________________________________________________________  The  GI providers would like to encourage you to use [facility] to communicate with providers for non-urgent requests or questions.  Due to long hold times on the telephone, sending your provider a message by [facility] may be a faster and more efficient way to get a response.  Please allow 48 business hours for a response.  Please remember that this is for non-urgent requests.   Your provider has requested that you go to the basement level for lab work before leaving today. Press "B" on the elevator. The lab is located at the first door on the left as you exit the elevator.  You have been scheduled for an endoscopy and colonoscopy. Please follow the written instructions given to you at your visit today. Please pick up your prep supplies at the pharmacy within the next 1-3 days. If you use inhalers (even only as needed), please bring them with you on the day of your procedure.  Follow up pending at this time.  Thank you for entrusting me with your care and choosing [facility].  Amy Esterwood, PA-C

## 2021-10-25 NOTE — Progress Notes (Signed)
Subjective:    Patient ID: Wesley Webb, male    DOB: 16-Jun-1946, 75 y.o.   MRN: [id number]  HPI Kalim is a pleasant 75 year old African-American male, new to GI today referred by [facility] Fletcher Anon NP for evaluation of microcytic anemia.   Patient has not had any prior GI evaluation.   He does have history of hypertension, hyperlipidemia, memory loss, BPH, chronic low back pain and current bedbug infestation (home being treated) Patient had labs done on 09/08/2021 showing WBC 8.0, hemoglobin 7.5/hematocrit 25.2/MCV 79/platelets 356. He has not done Hemoccults are iron studies.  Review of previous labs shows hemoglobin 12.4 in March 2022 and hemoglobin 11.4 in September 2020. Patient denies any melena or hematochezia, but says he is not absolutely sure because he does not pay that much attention.  He has not noted any changes in bowel habits, has no complaint of abdominal pain.  Appetite has been fine, no nausea or vomiting.  He does feel that his weight is down about 5 pounds over the past couple of months. He takes Advil perhaps 1/day for back pain, no other NSAID use, no aspirin, no EtOH.  Family history is negative for GI disease as far as he is aware.  Review of Systems  Pertinent positive and negative review of systems were noted in the above HPI section.  All other review of systems was otherwise negative.   Outpatient Encounter Medications as of 10/25/2021  Medication Sig   COMBIGAN 0.2-0.5 % ophthalmic solution    diclofenac Sodium (VOLTAREN) 1 % GEL Apply 2 g topically 4 (four) times daily.   hydrochlorothiazide (HYDRODIURIL) 25 MG tablet Take 1 tablet by mouth once daily   hydrocortisone cream 1 % Apply to affected area 2 times daily   ibuprofen (ADVIL) 800 MG tablet TAKE 1 TABLET BY MOUTH EVERY 8 HOURS AS NEEDED   LUMIGAN 0.01 % SOLN INSTILL 1 DROP INTO EACH EYE EVERY EVENING   Na Sulfate-K Sulfate-Mg Sulf 17.5-3.13-1.6 GM/177ML SOLN Take 1 kit by mouth  once for 1 dose.   predniSONE (STERAPRED UNI-PAK 21 TAB) 10 MG (21) TBPK tablet Take as directed   [DISCONTINUED] amLODipine (NORVASC) 10 MG tablet Take 1 tablet by mouth once daily   No facility-administered encounter medications on file as of 10/25/2021.   No Known Allergies Patient Active Problem List   Diagnosis Date Noted   Urinary urgency 05/30/2019   Mixed hyperlipidemia 09/17/2018   Essential hypertension 09/17/2018   Social History   Socioeconomic History   Marital status: Married    Spouse name: Not on file   Number of children: Not on file   Years of education: Not on file   Highest education level: Not on file  Occupational History   Occupation: works part time security  Tobacco Use   Smoking status: Never   Smokeless tobacco: Never  Vaping Use   Vaping Use: Never used  Substance and Sexual Activity   Alcohol use: No   Drug use: No   Sexual activity: Yes  Other Topics Concern   Not on file  Social History Narrative   Not on file   Social Determinants of Health   Financial Resource Strain: Low Risk    Difficulty of Paying Living Expenses: Not hard at all  Food Insecurity: No Food Insecurity   Worried About Charity fundraiser in the Last Year: Never true   [place] in the Last Year: Never true  Transportation Needs: No Transportation  Needs   Lack of Transportation (Medical): No   Lack of Transportation (Non-Medical): No  Physical Activity: Inactive   Days of Exercise per Week: 0 days   Minutes of Exercise per Session: 0 min  Stress: No Stress Concern Present   Feeling of Stress : Not at all  Social Connections: Not on file  Intimate Partner Violence: Not on file    Mr. Rohrman family history includes Alcohol abuse in his father; Cancer in his father; Glaucoma in his mother; Hypertension in his father and mother; Kidney disease in his brother.      Objective:    Vitals:   10/25/21 1018  BP: 140/70  Pulse: 76    Physical Exam  .Well-developed well-nourished older African-American male in no acute distress.  Height, Weight, 195 BMI 30.5  HEENT; nontraumatic normocephalic, EOMI, PE R LA, sclera anicteric. Oropharynx; not examined today Neck; supple, no JVD Cardiovascular; regular rate and rhythm with S1-S2, no murmur rub or gallop Pulmonary; Clear bilaterally Abdomen; soft, nontender, nondistended, no palpable mass or hepatosplenomegaly, bowel sounds are active Rectal; not done today Skin; benign exam, no jaundice rash or appreciable lesions Extremities; no clubbing cyanosis or edema skin warm and dry Neuro/Psych; alert and oriented x4, grossly nonfocal mood and affect appropriate        Assessment & Plan:   #71 75 year old African-American male with new onset of anemia borderline microcytic.  Most recent hemoglobin 7.5 on 09/08/2021. He is asymptomatic from a GI perspective, unaware of any melena or hematochezia. No prior GI evaluation.  Will need to rule out occult colonic neoplasm, AVMs, chronic gastropathy, peptic ulcer disease  #2 hypertension #3.  Hyperlipidemia #4.  History of memory loss #5.  BPH #6.  Chronic low back pain #7 bedbug infestation-being treated  Plan; repeat CBC today, iron studies Patient will be scheduled for EGD and colonoscopy with Dr. Silverio Decamp . Both procedures were discussed in detail with the patient and his daughter, and he is agreeable to proceed. Further recommendations pending findings at endoscopic evaluation. As of today's office visit patient is appropriate for endoscopic evaluation in the ambulatory care setting.     Lynde Ludwig S Renaye Janicki PA-C 10/25/2021   Cc: Minette Brine, FNP

## 2021-10-26 ENCOUNTER — Other Ambulatory Visit: Payer: Self-pay

## 2021-10-26 DIAGNOSIS — H401133 Primary open-angle glaucoma, bilateral, severe stage: Secondary | ICD-10-CM | POA: Diagnosis not present

## 2021-10-26 DIAGNOSIS — H47233 Glaucomatous optic atrophy, bilateral: Secondary | ICD-10-CM | POA: Diagnosis not present

## 2021-10-26 DIAGNOSIS — H43813 Vitreous degeneration, bilateral: Secondary | ICD-10-CM | POA: Diagnosis not present

## 2021-10-26 DIAGNOSIS — D509 Iron deficiency anemia, unspecified: Secondary | ICD-10-CM

## 2021-10-27 ENCOUNTER — Telehealth: Payer: Medicare HMO

## 2021-10-27 ENCOUNTER — Telehealth: Payer: Self-pay

## 2021-10-27 NOTE — Telephone Encounter (Signed)
  Care Management   Follow Up Note   10/27/2021 Name: Wesley Webb MRN: [id number] DOB: [birth year]   Referred by: Arnette Felts, FNP Reason for referral : Chronic Care Management (Unsuccessful call)   An unsuccessful telephone outreach was attempted today. The patient was referred to the case management team for assistance with care management and care coordination. SW left a HIPAA compliant voice message requesting a return call.  Follow Up Plan: The care management team will reach out to the patient again over the next 30 days.   Bevelyn Ngo, BSW, CDP Social Worker, Certified Dementia Practitioner [facility] Care Management [phone]

## 2021-10-29 ENCOUNTER — Other Ambulatory Visit: Payer: Self-pay | Admitting: Nurse Practitioner

## 2021-10-29 DIAGNOSIS — R21 Rash and other nonspecific skin eruption: Secondary | ICD-10-CM

## 2021-11-02 ENCOUNTER — Other Ambulatory Visit: Payer: Self-pay | Admitting: Nurse Practitioner

## 2021-11-02 DIAGNOSIS — R609 Edema, unspecified: Secondary | ICD-10-CM

## 2021-11-02 DIAGNOSIS — I10 Essential (primary) hypertension: Secondary | ICD-10-CM

## 2021-11-09 ENCOUNTER — Ambulatory Visit: Payer: Medicare HMO | Admitting: Neurology

## 2021-11-10 ENCOUNTER — Telehealth: Payer: Medicare HMO

## 2021-11-10 ENCOUNTER — Telehealth: Payer: Self-pay

## 2021-11-10 NOTE — Telephone Encounter (Signed)
  Care Management   Follow Up Note   11/10/2021 Name: Wesley Webb MRN: [id number] DOB: 12-17-1946   Referred by: Arnette Felts, FNP Reason for referral : Chronic Care Management (RN CM Follow up call )   An unsuccessful telephone outreach was attempted today. The patient was referred to the case management team for assistance with care management and care coordination. I spoke with patient's daughter [name] briefly today, unfortunately she is not available to speak with me at this time but will give me a call back when its convenient.   Follow Up Plan: Telephone follow up appointment with care management team member scheduled for: 11/30/22  Delsa Sale, RN, BSN, CCM Care Management Coordinator [facility] Care Management/[facility]  Direct Phone: [phone]

## 2021-11-22 ENCOUNTER — Ambulatory Visit (INDEPENDENT_AMBULATORY_CARE_PROVIDER_SITE_OTHER): Payer: Medicare HMO

## 2021-11-22 ENCOUNTER — Encounter: Payer: Medicare HMO | Admitting: Gastroenterology

## 2021-11-22 ENCOUNTER — Telehealth: Payer: Self-pay

## 2021-11-22 DIAGNOSIS — R413 Other amnesia: Secondary | ICD-10-CM

## 2021-11-22 DIAGNOSIS — B888 Other specified infestations: Secondary | ICD-10-CM

## 2021-11-22 DIAGNOSIS — I10 Essential (primary) hypertension: Secondary | ICD-10-CM

## 2021-11-22 DIAGNOSIS — E782 Mixed hyperlipidemia: Secondary | ICD-10-CM

## 2021-11-22 NOTE — Chronic Care Management (AMB) (Signed)
Chronic Care Management    Social Work Note  11/22/2021 Name: Newell Wafer MRN: [id number] DOB: 1946-11-15  Jobany Montellano is a 75 y.o. year old male who is a primary care patient of Arnette Felts, FNP. The CCM team was consulted to assist the patient with chronic disease management and/or care coordination needs related to:  Infestation of bed bugs, HTN, Mixed Hyperlipidemia, Prediabetes .   Engaged with patients daughter Eyad Rochford by phone  for follow up visit in response to provider referral for social work chronic care management and care coordination services.   Consent to Services:  The patient was given information about Chronic Care Management services, agreed to services, and gave verbal consent prior to initiation of services.  Please see initial visit note for detailed documentation.   Patient agreed to services and consent obtained.   Assessment: Review of patient past medical history, allergies, medications, and health status, including review of relevant consultants reports was performed today as part of a comprehensive evaluation and provision of chronic care management and care coordination services.     SDOH (Social Determinants of Health) assessments and interventions performed:    Advanced Directives Status: Not addressed in this encounter.  CCM Care Plan  No Known Allergies  Outpatient Encounter Medications as of 11/22/2021  Medication Sig   COMBIGAN 0.2-0.5 % ophthalmic solution    diclofenac Sodium (VOLTAREN) 1 % GEL Apply 2 g topically 4 (four) times daily.   hydrochlorothiazide (HYDRODIURIL) 25 MG tablet Take 1 tablet by mouth once daily   hydrocortisone cream 1 % APPLY  CREAM EXTERNALLY TO AFFECTED AREA TWICE DAILY   ibuprofen (ADVIL) 800 MG tablet TAKE 1 TABLET BY MOUTH EVERY 8 HOURS AS NEEDED   LUMIGAN 0.01 % SOLN INSTILL 1 DROP INTO EACH EYE EVERY EVENING   predniSONE (STERAPRED UNI-PAK 21 TAB) 10 MG (21) TBPK tablet Take as directed    [DISCONTINUED] amLODipine (NORVASC) 10 MG tablet Take 1 tablet by mouth once daily   No facility-administered encounter medications on file as of 11/22/2021.    Patient Active Problem List   Diagnosis Date Noted   Urinary urgency 05/30/2019   Mixed hyperlipidemia 09/17/2018   Essential hypertension 09/17/2018    Conditions to be addressed/monitored:  Infestation of bed bugs, HTN, Mixed Hyperlipidemia, Prediabetes  Care Plan : Social Work [facility] Care Plan  Updates made by Bevelyn Ngo since 11/22/2021 12:00 AM     Problem: Barriers to Treatment      Long-Range Goal: Barriers to Treatment Identified and Managed   Start Date: 10/05/2021  This Visit's Progress: On track  Priority: High  Note:   Current Barriers:  Chronic disease management support and education needs related to  HTN, Mixed Hyperlipidemia, Prediabetes   Bed bug infestation   Cognitive decline Weight loss of 10 pounds or more in last 6 months  Social Worker Clinical Goal(s):  patient will work with SW to identify and address any acute and/or chronic care coordination needs related to the self health management of  HTN, Mixed Hyperlipidemia, and Prediabetes   patient will work with SW to address concerns related to bed bug infestation Patient will engage with RN Care Manager to develop an individualized plan of care to address disease management needs  SW Interventions:  Inter-disciplinary care team collaboration (see longitudinal plan of care) Collaboration with Arnette Felts, FNP regarding development and update of comprehensive plan of care as evidenced by provider attestation and co-signature Telephonic visit completed with patients daughter [name]  [place] to assess goal progression Discussed the patient did attend initial appointment with gastroenterology and has an endoscopy scheduled 12/15 Determined the patient has yet to see the neurologist due to a scheduling contact - [name] reports plans to contact [facility] this afternoon to schedule patients appointment Reviewed patients family has been working closely with adult protective services caseworker and Payton Prinsen has completed the petition for guardianship. [name] reports she has sent this form to her sister to proof read and will plan to file petition by the end of this week Discussed the patient is doing well in the home, family has taken some steps to help eradicate bed bug infestation Reviewed plans for SW to follow up with [name] over the next 45 days  Patient Goals/Self-Care Activities patient will: with the help of his family  -  Engage with APS as needed  -Submit petition for guardianship -Schedule initial Neurology evaluation -Attend all upcomming appointments -Contact SW as needed prior to next scheduled call  Follow Up Plan:  SW will follow up over the next 45 days       Follow Up Plan: SW will follow up with patient by phone over the next 45 days      Bevelyn Ngo, BSW, CDP Social Worker, Certified Dementia Practitioner [facility] Care Management [phone]

## 2021-11-22 NOTE — Patient Instructions (Signed)
Social Worker Visit Information  Goals we discussed today:   Goals Addressed             This Visit's Progress    Barriers to Treatment Identified and Managed   On track    Timeframe:  Long-Range Goal Priority:  High Start Date:   10.11.22                                           Next planned outreach: 12.29.22  Patient Goals/Self-Care Activities patient will: with the help of his family  - Engage with APS as needed  -Submit petition for guardianship -Schedule initial Neurology evaluation -Attend all upcomming appointments -Contact SW as needed prior to next scheduled call         Patient verbalizes understanding of instructions provided today and agrees to view in MyChart.   Follow Up Plan: SW will follow up with patient by phone over the next 45 days   Bevelyn Ngo, BSW, CDP Social Worker, Certified Dementia Practitioner [facility] Care Management [phone]

## 2021-11-22 NOTE — Telephone Encounter (Signed)
  Care Management   Follow Up Note   11/22/2021 Name: Wesley Webb MRN: [id number] DOB: 05-07-1946   Referred by: Arnette Felts, FNP Reason for referral : Chronic Care Management   Successful outbound call placed to the patients daughter, Wesley Webb to assess goal progression. Discussed Wesley Webb is unable to complete the call at this time and requests to contact SW later this afternoon.  Follow Up Plan:  SW will await return call. If no return call is received SW will attempt to contact Elaine over the next month.  Bevelyn Ngo, BSW, CDP Social Worker, Certified Dementia Practitioner [facility] Care Management [phone]

## 2021-11-24 DIAGNOSIS — I10 Essential (primary) hypertension: Secondary | ICD-10-CM

## 2021-11-24 DIAGNOSIS — E782 Mixed hyperlipidemia: Secondary | ICD-10-CM

## 2021-11-30 ENCOUNTER — Telehealth: Payer: Medicare HMO

## 2021-12-01 ENCOUNTER — Encounter: Payer: Medicare HMO | Admitting: Nurse Practitioner

## 2021-12-01 NOTE — Patient Instructions (Signed)

## 2021-12-01 NOTE — Progress Notes (Deleted)
  I,Sidda Humm T Lovis More,acting as a Neurosurgeon for Arnette Felts, FNP.,have documented all relevant documentation on the behalf of Arnette Felts, FNP,as directed by  Arnette Felts, FNP while in the presence of Arnette Felts, FNP.  This visit occurred during the SARS-CoV-2 public health emergency.  Safety protocols were in place, including screening questions prior to the visit, additional usage of staff PPE, and extensive cleaning of exam room while observing appropriate contact time as indicated for disinfecting solutions.  Subjective:     Patient ID: Wesley Webb , male    DOB: 06/01/1946 , 75 y.o.   MRN: [id number]   Chief Complaint  Patient presents with   Hypertension   Diabetes     HPI  Pt here for BP & DM f/u.   Hypertension  Diabetes    Past Medical History:  Diagnosis Date   Anemia    Brain aneurysm    Cognitive decline    Hypertension    Infestation by bed bug      Family History  Problem Relation Age of Onset   Glaucoma Mother    Hypertension Mother    Hypertension Father    Cancer Father    Alcohol abuse Father    Kidney disease Brother      Current Outpatient Medications:    COMBIGAN 0.2-0.5 % ophthalmic solution, , Disp: , Rfl:    diclofenac Sodium (VOLTAREN) 1 % GEL, Apply 2 g topically 4 (four) times daily., Disp: 100 g, Rfl: 2   hydrochlorothiazide (HYDRODIURIL) 25 MG tablet, Take 1 tablet by mouth once daily, Disp: 90 tablet, Rfl: 0   hydrocortisone cream 1 %, APPLY  CREAM EXTERNALLY TO AFFECTED AREA TWICE DAILY, Disp: 29 g, Rfl: 0   ibuprofen (ADVIL) 800 MG tablet, TAKE 1 TABLET BY MOUTH EVERY 8 HOURS AS NEEDED, Disp: 30 tablet, Rfl: 0   LUMIGAN 0.01 % SOLN, INSTILL 1 DROP INTO EACH EYE EVERY EVENING, Disp: , Rfl:    predniSONE (STERAPRED UNI-PAK 21 TAB) 10 MG (21) TBPK tablet, Take as directed, Disp: 21 tablet, Rfl: 0   No Known Allergies   Review of Systems  Constitutional: Negative.   HENT: Negative.    Eyes: Negative.   Gastrointestinal:  Negative.   Genitourinary: Negative.   Skin: Negative.   Allergic/Immunologic: Negative.   Hematological: Negative.     There were no vitals filed for this visit. There is no height or weight on file to calculate BMI.   Objective:  Physical Exam      Assessment And Plan:     1. Essential hypertension  2. Prediabetes  3. Mixed hyperlipidemia     Patient was given opportunity to ask questions. Patient verbalized understanding of the plan and was able to repeat key elements of the plan. All questions were answered to their satisfaction.  Coolidge Breeze, CMA   I, Coolidge Breeze, CMA, have reviewed all documentation for this visit. The documentation on 12/01/21 for the exam, diagnosis, procedures, and orders are all accurate and complete.   IF YOU HAVE BEEN REFERRED TO A SPECIALIST, IT MAY TAKE 1-2 WEEKS TO SCHEDULE/PROCESS THE REFERRAL. IF YOU HAVE NOT HEARD FROM US/SPECIALIST IN TWO WEEKS, PLEASE GIVE [place] A CALL AT [phone].   THE PATIENT IS ENCOURAGED TO PRACTICE SOCIAL DISTANCING DUE TO THE COVID-19 PANDEMIC.

## 2021-12-02 ENCOUNTER — Other Ambulatory Visit: Payer: Self-pay

## 2021-12-02 ENCOUNTER — Ambulatory Visit (HOSPITAL_COMMUNITY)
Admission: RE | Admit: 2021-12-02 | Discharge: 2021-12-02 | Disposition: A | Discharge status: Home / Self Care | Payer: Medicare HMO | Source: Ambulatory Visit | Attending: Nurse Practitioner | Admitting: Nurse Practitioner

## 2021-12-02 DIAGNOSIS — R600 Localized edema: Secondary | ICD-10-CM | POA: Insufficient documentation

## 2021-12-02 NOTE — Progress Notes (Signed)
Not see, dgt to return call to reschedule

## 2021-12-07 ENCOUNTER — Telehealth: Payer: Self-pay | Admitting: Gastroenterology

## 2021-12-07 NOTE — Telephone Encounter (Signed)
Good Morning Dr. Lavon Paganini   Patients transportation has Covid and he will not be able to make it to his procedure on 12/15 at 1:30pm.  Will call back at a later time to reschedule.

## 2021-12-07 NOTE — Telephone Encounter (Signed)
ok 

## 2021-12-08 ENCOUNTER — Ambulatory Visit: Payer: Medicare HMO | Admitting: Nurse Practitioner

## 2021-12-09 ENCOUNTER — Encounter: Payer: Medicare HMO | Admitting: Gastroenterology

## 2021-12-09 ENCOUNTER — Ambulatory Visit: Payer: Medicare HMO | Admitting: Neurology

## 2021-12-16 ENCOUNTER — Ambulatory Visit: Payer: Medicare HMO | Admitting: Psychiatry

## 2021-12-23 ENCOUNTER — Telehealth: Payer: Medicare HMO

## 2021-12-23 ENCOUNTER — Telehealth: Payer: Self-pay

## 2021-12-23 NOTE — Telephone Encounter (Signed)
°  Care Management   Follow Up Note   12/23/2021 Name: Wesley Webb MRN: [id number] DOB: [birth year]   Referred by: Arnette Felts, FNP Reason for referral : Chronic Care Management (Unsuccessful call)   An unsuccessful telephone outreach was attempted today. The patient was referred to the case management team for assistance with care management and care coordination. SW left a HIPAA compliant voice message requesting a return call.  Follow Up Plan: The care management team will reach out to the patient again over the next 21 days.   Bevelyn Ngo, BSW, CDP Social Worker, Certified Dementia Practitioner [facility] Care Management [phone]

## 2021-12-23 NOTE — Progress Notes (Signed)
Reviewed and agree with documentation and assessment and plan. K. Veena Mi Balla , MD   

## 2021-12-23 NOTE — Progress Notes (Signed)
Reviewed and agree with documentation and assessment and plan. K. Veena Maylee Bare , MD   

## 2021-12-24 ENCOUNTER — Ambulatory Visit: Payer: Medicare HMO | Admitting: Neurology

## 2021-12-28 ENCOUNTER — Ambulatory Visit (INDEPENDENT_AMBULATORY_CARE_PROVIDER_SITE_OTHER): Payer: Medicare HMO

## 2021-12-28 DIAGNOSIS — B888 Other specified infestations: Secondary | ICD-10-CM

## 2021-12-28 DIAGNOSIS — R413 Other amnesia: Secondary | ICD-10-CM

## 2021-12-28 DIAGNOSIS — I10 Essential (primary) hypertension: Secondary | ICD-10-CM

## 2021-12-28 DIAGNOSIS — E782 Mixed hyperlipidemia: Secondary | ICD-10-CM

## 2021-12-28 NOTE — Patient Instructions (Signed)
Social Worker Visit Information  Goals we discussed today:   Goals Addressed             This Visit's Progress    Barriers to Treatment Identified and Managed   On track    Timeframe:  Long-Range Goal Priority:  High Start Date:   10.11.22                                           Next planned outreach: 2.3.23  Patient Goals/Self-Care Activities patient will: with the help of his family  - Engage with APS as needed  -Submit petition for guardianship -Schedule initial Neurology evaluation -Attend all upcomming appointments -Contact SW as needed prior to next scheduled call         Materials Provided: Verbal education about process to obtain records provided by phone  Patient verbalizes understanding of instructions provided today and agrees to view in MyChart.   Follow Up Plan: SW will follow up with patient by phone over the next 45 days   Bevelyn Ngo, BSW, CDP Social Worker, Certified Dementia Practitioner [facility] Care Management [phone]

## 2021-12-28 NOTE — Chronic Care Management (AMB) (Signed)
Chronic Care Management    Social Work Note  12/28/2021 Name: Wesley Webb MRN: [id number] DOB: [birth year]  Wesley Webb is a 76 y.o. year old male who is a primary care patient of Wesley Webb, Wesley Webb. The CCM team was consulted to assist the patient with chronic disease management and/or care coordination needs related to:  Infestation of bed bugs, HTN, Mixed Hyperlipidemia .   Engaged with Wesley Webb by phone  for follow up visit in response to provider referral for social work chronic care management and care coordination services.   Consent to Services:  The patient was given information about Chronic Care Management services, agreed to services, and gave verbal consent prior to initiation of services.  Please see initial visit note for detailed documentation.   Patient agreed to services and consent obtained.   Assessment: Review of patient past medical history, allergies, medications, and health status, including review of relevant consultants reports was performed today as part of a comprehensive evaluation and provision of chronic care management and care coordination services.     SDOH (Social Determinants of Health) assessments and interventions performed:    Advanced Directives Status: Not addressed in this encounter.  CCM Care Plan  No Known Allergies  Outpatient Encounter Medications as of 12/28/2021  Medication Sig   COMBIGAN 0.2-0.5 % ophthalmic solution    diclofenac Sodium (VOLTAREN) 1 % GEL Apply 2 g topically 4 (four) times daily.   hydrochlorothiazide (HYDRODIURIL) 25 MG tablet Take 1 tablet by mouth once daily   hydrocortisone cream 1 % APPLY  CREAM EXTERNALLY TO AFFECTED AREA TWICE DAILY   ibuprofen (ADVIL) 800 MG tablet TAKE 1 TABLET BY MOUTH EVERY 8 HOURS AS NEEDED   LUMIGAN 0.01 % SOLN INSTILL 1 DROP INTO EACH EYE EVERY EVENING   predniSONE (STERAPRED UNI-PAK 21 TAB) 10 MG (21) TBPK tablet Take as directed   [DISCONTINUED] amLODipine (NORVASC) 10 MG  tablet Take 1 tablet by mouth once daily   No facility-administered encounter medications on file as of 12/28/2021.    Patient Active Problem List   Diagnosis Date Noted   Urinary urgency 05/30/2019   Mixed hyperlipidemia 09/17/2018   Essential hypertension 09/17/2018    Conditions to be addressed/monitored:  HTN, Mixed Hyperlipidemia ; Cognitive Deficits and Infestation of bed bugs  Care Plan : Social Work [facility] Care Plan  Updates made by Wesley Webb since 12/28/2021 12:00 AM     Problem: Barriers to Treatment      Long-Range Goal: Barriers to Treatment Identified and Managed   Start Date: 10/05/2021  Recent Progress: On track  Priority: High  Note:   Current Barriers:  Chronic disease management support and education needs related to  HTN, Mixed Hyperlipidemia, Prediabetes   Bed bug infestation   Cognitive decline Weight loss of 10 pounds or more in last 6 months  Social Worker Clinical Goal(s):  patient will work with SW to identify and address any acute and/or chronic care coordination needs related to the self health management of  HTN, Mixed Hyperlipidemia, and Prediabetes   patient will work with SW to address concerns related to bed bug infestation Patient will engage with RN Care Manager to develop an individualized plan of care to address disease management needs  SW Interventions:  Inter-disciplinary care team collaboration (see longitudinal plan of care) Collaboration with Wesley Webb, Wesley Webb regarding development and update of comprehensive plan of care as evidenced by provider attestation and co-signature Voice message received from patients daughter indicating she  has not yet submitted guardianship paperwork as she was waiting on outcome of neurology referral. Unfortunately the patients daughter who provides transportation contracted covid and initialy neurology appointment had to be canceled Outbound call placed to patients daughter Wesley Webb who is requesting copies  of office visit notes to support need for guardianship due to patients decline in cognition Wesley Webb she would need to speak with office manager Wesley Webb to request a release of medical records; discussed plan for SW to contact Wesley Webb to request she contact Wesley Webb directly Communication sent to [name] advising of above request; requested she contact Wesley Webb directly  Patient Goals/Self-Care Activities patient will: with the help of his family  -  Engage with APS as needed  -Submit petition for guardianship -Schedule initial Neurology evaluation -Attend all upcomming appointments -Contact SW as needed prior to next scheduled call  Follow Up Plan:  SW will follow up over the next 45 days       Follow Up Plan: SW will follow up with patient by phone over the next 45 days.      Wesley Webb, BSW, CDP Social Worker, Certified Dementia Practitioner [facility] Care Management [phone]

## 2021-12-29 DIAGNOSIS — H50111 Monocular exotropia, right eye: Secondary | ICD-10-CM | POA: Diagnosis not present

## 2021-12-29 DIAGNOSIS — H43813 Vitreous degeneration, bilateral: Secondary | ICD-10-CM | POA: Diagnosis not present

## 2021-12-29 DIAGNOSIS — H401133 Primary open-angle glaucoma, bilateral, severe stage: Secondary | ICD-10-CM | POA: Diagnosis not present

## 2021-12-29 DIAGNOSIS — H16223 Keratoconjunctivitis sicca, not specified as Sjogren's, bilateral: Secondary | ICD-10-CM | POA: Diagnosis not present

## 2021-12-29 DIAGNOSIS — H47233 Glaucomatous optic atrophy, bilateral: Secondary | ICD-10-CM | POA: Diagnosis not present

## 2022-01-04 ENCOUNTER — Telehealth: Payer: Medicare HMO

## 2022-01-04 ENCOUNTER — Telehealth: Payer: Self-pay

## 2022-01-04 NOTE — Telephone Encounter (Signed)
°  Care Management   Follow Up Note   01/04/2022 Name: Haney Simonet MRN: [id number] DOB: July 29, 1946   Referred by: Minette Brine, FNP Reason for referral : Chronic Care Management (RN CM Follow up call )   An unsuccessful telephone outreach was attempted today. The patient was referred to the case management team for assistance with care management and care coordination.   Follow Up Plan: Telephone follow up appointment with care management team member scheduled for: 01/05/22  Barb Merino, RN, BSN, CCM Care Management Coordinator [place] [facility]  Direct Phone: [phone]

## 2022-01-05 ENCOUNTER — Telehealth: Payer: Medicare PPO

## 2022-01-05 ENCOUNTER — Telehealth: Payer: Self-pay

## 2022-01-05 NOTE — Telephone Encounter (Signed)
°  Care Management   Follow Up Note   01/05/2022 Name: Wesley Webb MRN: [id number] DOB: 06-27-1946   Referred by: Arnette Felts, FNP Reason for referral : Chronic Care Management (RN CM Follow up call - 2nd attempt )   A second unsuccessful telephone outreach was attempted today. The patient was referred to the case management team for assistance with care management and care coordination.   Follow Up Plan: Telephone follow up appointment with care management team member scheduled for: 02/02/22   Delsa Sale, RN, BSN, CCM Care Management Coordinator [facility] Care Management/[facility]  Direct Phone: [phone]

## 2022-01-12 ENCOUNTER — Telehealth: Payer: Medicare HMO

## 2022-01-25 ENCOUNTER — Telehealth: Payer: Self-pay

## 2022-01-25 DIAGNOSIS — E782 Mixed hyperlipidemia: Secondary | ICD-10-CM

## 2022-01-25 DIAGNOSIS — I10 Essential (primary) hypertension: Secondary | ICD-10-CM

## 2022-01-25 NOTE — Telephone Encounter (Signed)
Spoke with the daughter. She and her sister will coordinate getting the patient to the lab.

## 2022-01-25 NOTE — Telephone Encounter (Signed)
-----   Message from Evalee Jefferson, LPN sent at [phone]  4:35 PM EDT ----- Needs a repeat Ferritin in Feb 2023

## 2022-01-28 ENCOUNTER — Ambulatory Visit (INDEPENDENT_AMBULATORY_CARE_PROVIDER_SITE_OTHER): Payer: Medicare PPO

## 2022-01-28 DIAGNOSIS — I10 Essential (primary) hypertension: Secondary | ICD-10-CM

## 2022-01-28 DIAGNOSIS — R7303 Prediabetes: Secondary | ICD-10-CM

## 2022-01-28 DIAGNOSIS — E782 Mixed hyperlipidemia: Secondary | ICD-10-CM

## 2022-01-28 DIAGNOSIS — R413 Other amnesia: Secondary | ICD-10-CM

## 2022-01-28 NOTE — Chronic Care Management (AMB) (Signed)
Chronic Care Management    Social Work Note  01/28/2022 Name: Wesley Webb MRN: [id number] DOB: 07-23-1946  Dub Maclellan is a 76 y.o. year old male who is a primary care patient of Arnette Felts, FNP. The CCM team was consulted to assist the patient with chronic disease management and/or care coordination needs related to:  HTN, Mixed Hyperlipidemia, Prediabetes, infestation of bed bugs .   Engaged with patients daughter and caregiver Davionne Dowty  for follow up visit in response to provider referral for social work chronic care management and care coordination services.   Consent to Services:  The patient was given information about Chronic Care Management services, agreed to services, and gave verbal consent prior to initiation of services.  Please see initial visit note for detailed documentation.   Patient agreed to services and consent obtained.   Assessment: Review of patient past medical history, allergies, medications, and health status, including review of relevant consultants reports was performed today as part of a comprehensive evaluation and provision of chronic care management and care coordination services.     SDOH (Social Determinants of Health) assessments and interventions performed:    Advanced Directives Status: Not addressed in this encounter.  CCM Care Plan  No Known Allergies  Outpatient Encounter Medications as of 01/28/2022  Medication Sig   COMBIGAN 0.2-0.5 % ophthalmic solution    diclofenac Sodium (VOLTAREN) 1 % GEL Apply 2 g topically 4 (four) times daily.   hydrochlorothiazide (HYDRODIURIL) 25 MG tablet Take 1 tablet by mouth once daily   hydrocortisone cream 1 % APPLY  CREAM EXTERNALLY TO AFFECTED AREA TWICE DAILY   ibuprofen (ADVIL) 800 MG tablet TAKE 1 TABLET BY MOUTH EVERY 8 HOURS AS NEEDED   LUMIGAN 0.01 % SOLN INSTILL 1 DROP INTO EACH EYE EVERY EVENING   predniSONE (STERAPRED UNI-PAK 21 TAB) 10 MG (21) TBPK tablet Take as directed    [DISCONTINUED] amLODipine (NORVASC) 10 MG tablet Take 1 tablet by mouth once daily   No facility-administered encounter medications on file as of 01/28/2022.    Patient Active Problem List   Diagnosis Date Noted   Urinary urgency 05/30/2019   Mixed hyperlipidemia 09/17/2018   Essential hypertension 09/17/2018    Conditions to be addressed/monitored:  HTN, Mixed Hyperlipidemia, Prediabetes ;  Memory deficits  Care Plan : Social Work [facility] Care Plan  Updates made by Bevelyn Ngo since 01/28/2022 12:00 AM     Problem: Barriers to Treatment      Long-Range Goal: Barriers to Treatment Identified and Managed   Start Date: 10/05/2021  This Visit's Progress: On track  Recent Progress: On track  Priority: High  Note:   Current Barriers:  Chronic disease management support and education needs related to  HTN, Mixed Hyperlipidemia, Prediabetes   Bed bug infestation   Cognitive decline Weight loss of 10 pounds or more in last 6 months  Social Worker Clinical Goal(s):  patient will work with SW to identify and address any acute and/or chronic care coordination needs related to the self health management of  HTN, Mixed Hyperlipidemia, and Prediabetes   patient will work with SW to address concerns related to bed bug infestation Patient will engage with RN Care Manager to develop an individualized plan of care to address disease management needs  SW Interventions:  Inter-disciplinary care team collaboration (see longitudinal plan of care) Collaboration with Arnette Felts, FNP regarding development and update of comprehensive plan of care as evidenced by provider attestation and co-signature Telephonic visit  completed with patients daughter Edem Tiegs to assess for care coordination needs Discussed the family has chosen to pursue Power of Attorney instead of Guardianship due to concerns with the patients ability to manage his own finances Determined Mrs. Lenzen has yet to be contacted by  office manager to discuss obtaining medical records to support concerns with patients cognition Advised Mrs. Merced Hanners would collaborate with office manager Misti Sellars to request follow up with Mrs. Ziebell. Encouraged Mrs. Minion to contact the practice if she does not hear from the office manager over the next 10 days Discussed Mrs. Cerrone and her siblings are actively discussing which sibling will be most appropriate to act as power of attorney, this is an ongoing conversation Determined the patients bed bug infestation has improved with the help of souse and children Scheduled follow up call over the next 45 days to assess for ongoing care coordination needs  Patient Goals/Self-Care Activities patient will: with the help of his family -Identify appropriate family member to serve as Chief Technology Officer -Follow up with primary providers office to obtain medical records -Attend all upcomming appointments -Contact SW as needed prior to next scheduled call  Follow Up Plan:  SW will follow up over the next 45 days       Follow Up Plan: SW will follow up with patient by phone over the next 45 days.      Bevelyn Ngo, BSW, CDP Social Worker, Certified Dementia Practitioner [facility] Care Management [phone]

## 2022-01-28 NOTE — Patient Instructions (Signed)
Social Worker Visit Information  Goals we discussed today:   Goals Addressed             This Visit's Progress    Barriers to Treatment Identified and Managed   On track    Timeframe:  Long-Range Goal Priority:  High Start Date:   10.11.22                                           Next planned outreach: 3.9.23  Patient Goals/Self-Care Activities patient will: with the help of his family -Identify appropriate family member to serve as Chief Technology Officer -Follow up with primary providers office to obtain medical records -Attend all upcomming appointments -Contact SW as needed prior to next scheduled call         Patient verbalizes understanding of instructions and care plan provided today and agrees to view in MyChart. Active MyChart status confirmed with patient.    Follow Up Plan: SW will follow up with patient by phone over the next 45 days   Bevelyn Ngo, BSW, CDP Social Worker, Certified Dementia Practitioner [facility] [phone]

## 2022-02-02 ENCOUNTER — Ambulatory Visit: Payer: Self-pay

## 2022-02-02 ENCOUNTER — Telehealth: Payer: Medicare PPO

## 2022-02-02 DIAGNOSIS — B888 Other specified infestations: Secondary | ICD-10-CM

## 2022-02-02 DIAGNOSIS — E782 Mixed hyperlipidemia: Secondary | ICD-10-CM

## 2022-02-02 DIAGNOSIS — R7303 Prediabetes: Secondary | ICD-10-CM

## 2022-02-02 DIAGNOSIS — R413 Other amnesia: Secondary | ICD-10-CM

## 2022-02-02 DIAGNOSIS — I10 Essential (primary) hypertension: Secondary | ICD-10-CM

## 2022-02-02 NOTE — Patient Instructions (Signed)
Visit Information  Thank you for taking time to visit with me today. Please don't hesitate to contact me if I can be of assistance to you before our next scheduled telephone appointment.  Following are the goals we discussed today:  (Copy and paste patient goals from clinical care plan here)  Our next appointment is by telephone on 03/02/22 at 11:05 AM  Please call the care guide team at [phone] if you need to cancel or reschedule your appointment.   If you are experiencing a Mental Health or Behavioral Health Crisis or need someone to talk to, please call 1-800-273-TALK (toll free, 24 hour hotline)   Patient verbalizes understanding of instructions and care plan provided today and agrees to view in MyChart. Active MyChart status confirmed with patient.    Delsa Sale, RN, BSN, CCM Care Management Coordinator [facility] Care Management/[facility]  Direct Phone: [phone]

## 2022-02-02 NOTE — Chronic Care Management (AMB) (Signed)
Chronic Care Management   CCM RN Visit Note  02/02/2022 Name: Wesley Webb MRN: [id number] DOB: [birth year]  Subjective: Wesley Webb is a 76 y.o. year old male who is a primary care patient of Minette Brine, [place]. The care management team was consulted for assistance with disease management and care coordination needs.    Engaged with patient by telephone for follow up visit in response to provider referral for case management and/or care coordination services.   Consent to Services:  The patient was given information about Chronic Care Management services, agreed to services, and gave verbal consent prior to initiation of services.  Please see initial visit note for detailed documentation.   Patient agreed to services and verbal consent obtained.   Assessment: Review of patient past medical history, allergies, medications, health status, including review of consultants reports, laboratory and other test data, was performed as part of comprehensive evaluation and provision of chronic care management services.   SDOH (Social Determinants of Health) assessments and interventions performed:    CCM Care Plan  No Known Allergies  Outpatient Encounter Medications as of 02/02/2022  Medication Sig   COMBIGAN 0.2-0.5 % ophthalmic solution    diclofenac Sodium (VOLTAREN) 1 % GEL Apply 2 g topically 4 (four) times daily.   hydrochlorothiazide (HYDRODIURIL) 25 MG tablet Take 1 tablet by mouth once daily   hydrocortisone cream 1 % APPLY  CREAM EXTERNALLY TO AFFECTED AREA TWICE DAILY   ibuprofen (ADVIL) 800 MG tablet TAKE 1 TABLET BY MOUTH EVERY 8 HOURS AS NEEDED   LUMIGAN 0.01 % SOLN INSTILL 1 DROP INTO EACH EYE EVERY EVENING   predniSONE (STERAPRED UNI-PAK 21 TAB) 10 MG (21) TBPK tablet Take as directed   [DISCONTINUED] amLODipine (NORVASC) 10 MG tablet Take 1 tablet by mouth once daily   No facility-administered encounter medications on file as of 02/02/2022.    Patient Active Problem  List   Diagnosis Date Noted   Urinary urgency 05/30/2019   Mixed hyperlipidemia 09/17/2018   Essential hypertension 09/17/2018    Conditions to be addressed/monitored: Infestation by bed bug, Bedbug bite, subsequent encounter, Essential hypertension, Mixed hyperlipidemia, Prediabetes, Memory changes  Care Plan : RN Care Manager Plan of Care  Updates made by Lynne Logan, RN since 02/02/2022 12:00 AM     Problem: No plan of care established for management of Chronic disease states for Infestation by bed bug, Bedbug bite, subsequent encounter, Essential hypertension, Mixed hyperlipidemia, Prediabetes, Memory changes   Priority: High     Long-Range Goal: Development of plan of care for chronic disease management for Infestation by bed bug, Bedbug bite, subsequent encounter, Essential hypertension, Mixed hyperlipidemia, Prediabetes, Memory changes   Start Date: 10/20/2021  Expected End Date: 10/20/2022  Recent Progress: On track  Priority: High  Note:   Current Barriers:  Knowledge Deficits related to plan of care for management of Infestation by bed bug, Bedbug bite, subsequent encounter, Essential hypertension, Mixed hyperlipidemia, Prediabetes, Memory changes Chronic Disease Management support and education needs related to Infestation by bed bug, Bedbug bite, subsequent encounter, Essential hypertension, Mixed hyperlipidemia, Prediabetes, Memory changes Compulsive Hoarding   RNCM Clinical Goal(s):  Patient will verbalize understanding of plan for management of Infestation by bed bug, Bedbug bite, subsequent encounter, Essential hypertension, Mixed hyperlipidemia, Prediabetes, Memory changes take all medications exactly as prescribed and will call provider for medication related questions attend all scheduled medical appointments: [facility], Dr. April Manson scheduled for 11/09/21 @10 :9 AM demonstrate Improved health management independence   continue  to work with RN Care Manager to address  care management and care coordination needs related to  Infestation by bed bug, Bedbug bite, subsequent encounter, Essential hypertension, Mixed hyperlipidemia, Prediabetes, Memory changes work with Education officer, museum to address  related to the management of bed bug infestation, APS referral  related to the management of Infestation by bed bug, Bedbug bite, subsequent encounter, Essential hypertension, Mixed hyperlipidemia, Prediabetes, Memory changes will demonstrate ongoing self health care management ability    through collaboration with RN Care manager, provider, and care team.   Interventions: 1:1 collaboration with primary care provider regarding development and update of comprehensive plan of care as evidenced by provider attestation and co-signature Inter-disciplinary care team collaboration (see longitudinal plan of care) Evaluation of current treatment plan related to  self management and patient's adherence to plan as established by provider  Dementia Interventions: Status: (Condition stable.  Not addressed this visit.) Long Term Goal  Evaluation of current treatment plan related to  Cognitive decline with impaired judgement, Compulsive Hoarding, bed bug infestation , self-management and patient's adherence to plan as established by provider. 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 Pederson work through these issues Discussed cognitive changes with patient and daughter [name] feels his judgement is impaired, she believes this may be contributing to his lack of motivation to exterminate for the bed bugs  Reviewed scheduled/upcoming provider appointments including: [facility], Dr. Susa Griffins scheduled for 11/09/21 @10 :17 AM for evaluation of memory changes   Anemia Interventions: Status: (Goal on track:  Yes.) Long Term Goal Evaluation of current treatment plan related to  Abnormal Hemoglobin and Hematrocrit , self-management and patient's adherence to plan as established by provider. Completed successful telephonic call with daughter Shawntez Calderone regarding patients GI evaluation/treatment for abnormal hemoglobin and hematocrit Review of patient status, including review of consultant's reports, relevant laboratory and other test results, and medications completed Determined patient missed his appointment for Colonoscopy and Endoscopy scheduled in November due to patient's daughter was ill with COVID and was unable to transport patient to this appointment Instructed daughter [name] to call [facility] and provided contact number in order for this procedure to be rescheduled Discussed recommendations for a repeat Ferritin per GI recommendations, instructed daughter this lab can be obtained at [facility] and should be completed when possible per GI recommendations  Determined daughter [name] will schedule the appointments discussed and her sister will provide transportation for patient to his scheduled appointments  Discussed plans with patient for ongoing care management follow up and provided patient with direct contact information for care management team  Patient Goals/Self-Care Activities: Take all medications as prescribed Attend all scheduled provider appointments Call pharmacy for medication refills 3-7 days in advance of running out of  medications Call provider office for new concerns or questions  Work with the social worker to address care coordination needs and will continue to work with the clinical team to address health care and disease  management related needs  Follow Up Plan:  Telephone follow up appointment with care management team member scheduled for:  03/02/21      Plan:Telephone follow up appointment with care management team member scheduled for:  03/02/22  Barb Merino, RN, BSN, CCM Care Management Coordinator [facility] [facility]  Direct Phone: [phone]

## 2022-02-14 DIAGNOSIS — L509 Urticaria, unspecified: Secondary | ICD-10-CM | POA: Diagnosis not present

## 2022-02-17 DIAGNOSIS — R634 Abnormal weight loss: Secondary | ICD-10-CM | POA: Diagnosis not present

## 2022-02-17 DIAGNOSIS — L299 Pruritus, unspecified: Secondary | ICD-10-CM | POA: Diagnosis not present

## 2022-02-17 DIAGNOSIS — Z1211 Encounter for screening for malignant neoplasm of colon: Secondary | ICD-10-CM | POA: Diagnosis not present

## 2022-02-17 DIAGNOSIS — R195 Other fecal abnormalities: Secondary | ICD-10-CM | POA: Diagnosis not present

## 2022-02-17 DIAGNOSIS — Z9114 Patient's other noncompliance with medication regimen: Secondary | ICD-10-CM | POA: Diagnosis not present

## 2022-02-17 DIAGNOSIS — Z125 Encounter for screening for malignant neoplasm of prostate: Secondary | ICD-10-CM | POA: Diagnosis not present

## 2022-02-17 DIAGNOSIS — I10 Essential (primary) hypertension: Secondary | ICD-10-CM | POA: Diagnosis not present

## 2022-02-17 DIAGNOSIS — L819 Disorder of pigmentation, unspecified: Secondary | ICD-10-CM | POA: Diagnosis not present

## 2022-02-22 DIAGNOSIS — I10 Essential (primary) hypertension: Secondary | ICD-10-CM | POA: Diagnosis not present

## 2022-02-22 DIAGNOSIS — E782 Mixed hyperlipidemia: Secondary | ICD-10-CM

## 2022-02-27 DIAGNOSIS — R739 Hyperglycemia, unspecified: Secondary | ICD-10-CM | POA: Diagnosis not present

## 2022-02-27 DIAGNOSIS — D892 Hypergammaglobulinemia, unspecified: Secondary | ICD-10-CM | POA: Diagnosis not present

## 2022-03-02 ENCOUNTER — Telehealth: Payer: Medicare PPO

## 2022-03-02 ENCOUNTER — Telehealth: Payer: Self-pay

## 2022-03-02 NOTE — Telephone Encounter (Signed)
?  Care Management  ? ?Follow Up Note ? ? ?03/02/2022 ?Name: Rio Giammanco MRN: [id number] DOB: [birth year] ? ? ?Referred by: Minette Brine, FNP ?Reason for referral : Chronic Care Management (RN CM Follow up call ) ? ? ?An unsuccessful telephone outreach was attempted today. The patient was referred to the case management team for assistance with care management and care coordination.  ? ?Follow Up Plan: A HIPPA compliant phone message was left for the patient providing contact information and requesting a return call.  ? ?Barb Merino, RN, BSN, CCM ?Care Management Coordinator ?[place] Management/[facility]  ?Direct Phone: [phone] ? ? ?

## 2022-03-03 ENCOUNTER — Telehealth: Payer: Self-pay

## 2022-03-03 ENCOUNTER — Telehealth: Payer: Medicare PPO

## 2022-03-03 NOTE — Telephone Encounter (Signed)
?  Care Management  ? ?Follow Up Note ? ? ?03/03/2022 ?Name: Wesley Webb MRN: [id number] DOB: [birth year] ? ? ?Referred by: Minette Brine, FNP ?Reason for referral : Chronic Care Management (Unsuccessful call) ? ? ?An unsuccessful telephone outreach was attempted today. The patient was referred to the case management team for assistance with care management and care coordination.  ? ?Follow Up Plan: The care management team will reach out to the patient again over the next 30 days.  ? ?Daneen Schick, BSW, CDP ?Social Worker, Certified Dementia Practitioner ?[facility] [place] [facility] ?[phone] ? ?   ? ? ?

## 2022-03-10 ENCOUNTER — Telehealth: Payer: Self-pay | Admitting: Hematology and Oncology

## 2022-03-10 NOTE — Telephone Encounter (Signed)
Scheduled appt per 3/15 referral. Pt is aware of appt date and time. Pt is aware to arrive 15 mins prior to appt time and to bring and updated insurance card. Pt is aware of appt location.   ?

## 2022-03-14 ENCOUNTER — Telehealth: Payer: Self-pay

## 2022-03-14 ENCOUNTER — Telehealth: Payer: Medicare PPO

## 2022-03-14 NOTE — Telephone Encounter (Signed)
?  Care Management  ? ?Follow Up Note ? ? ?03/14/2022 ?Name: Lowery Paullin MRN: [id number] DOB: 1946/12/16 ? ? ?Referred by: Arnette Felts, FNP ?Reason for referral : Chronic Care Management (RN CM Follow up call ) ? ? ?A second unsuccessful telephone outreach was attempted today. The patient was referred to the case management team for assistance with care management and care coordination.  ? ?Follow Up Plan: Telephone follow up appointment with care management team member scheduled for: 03/29/22 ? ?Delsa Sale, RN, BSN, CCM ?Care Management Coordinator ?[facility] Care Management/[facility]  ?Direct Phone: [phone] ? ? ?

## 2022-03-22 NOTE — Progress Notes (Signed)
?[facility] ?Telephone:([phone]   [phone] ? ?INITIAL CONSULT NOTE ? ?Patient Care Team: ?Arnette Felts, FNP as PCP - General (General Practice) ?Chalmers Guest, MD as Consulting Physician (Ophthalmology) ? ?Hematological/Oncological History ?# Abnormal SPEP Results ?02/17/2022: Hgb 12.4, Plt 218, MCV 90.6, WBC 6.8 ?03/07/2022: SPEP showed on or more serum protein fractions are outside normal ranges. No abnormal protein bands were apparent. UPEP showed no Bence Jones proteins.  ?03/23/2022: establish care with Dr. Leonides Schanz  ? ?CHIEF COMPLAINTS/PURPOSE OF CONSULTATION:  ?"Monoclonal Gammopathy  " ? ?HISTORY OF PRESENTING ILLNESS:  ?Wesley Webb 76 y.o. male with medical history significant for anemia, HTN, and cognitive decline who presents for evaluation of a monoclonal gammopathy.  ? ?On review of the previous records Wesley Webb had labs drawn on 02/17/2022 and was found to have hemoglobin 12.4, platelets of 218, MCV of 90.6, white blood cell count 6.8.  Additionally on 03/07/2022 he had an SPEP drawn which showed 1 or more serum protein fractions are elevated outside of the normal ranges.  There was no monoclonal protein notable.  Additionally had a UPEP which showed no Bence-Jones proteins.  Due to concern for the abnormal SPEP findings the patient was referred to hematology for further evaluation and management. ? ?On exam today Wesley Webb is accompanied by his son-in-law.  He reports that approximately 1 month ago he began developing a very pruritic rash on his arms and chest.  He notes that as part of his blood work he had an SPEP drawn.  Patient reports that he has not been having any issues with constipation after having started iron pills.  He notes that he is taking 1 pill/day and that he is not having any side effects as result of medication.  He is not having any issues with fatigue or shortness of breath.  He notes that he "cannot see well".  He has not had any issues with  bone pain or back pain. ? ?On further discussion he notes that his father passed away of lung cancer.  There is no other remarkable family history.  Patient quit smoking approximately 15 years ago.  He never drank any alcohol.  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  He notes that he is having a lot of gas but no urinary issues.  He reports no fevers, chills, sweats, nausea vomiting or diarrhea.  Full 10 point ROS is listed below. ? ?MEDICAL HISTORY:  ?Past Medical History:  ?Diagnosis Date  ? Anemia   ? Brain aneurysm   ? Cognitive decline   ? Hypertension   ? Infestation by bed bug   ? ? ?SURGICAL HISTORY: ?Past Surgical History:  ?Procedure Laterality Date  ? SHOULDER SURGERY    ? 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    ? ? ?SOCIAL HISTORY: ?Social History  ? ?Socioeconomic History  ? Marital status: Married  ?  Spouse name: Not on file  ? Number of children: Not on file  ? Years of education: Not on file  ? Highest education level: Not on file  ?Occupational History  ? Occupation: works part Scientific laboratory technician  ?Tobacco Use  ? Smoking status: Never  ? Smokeless tobacco: Never  ?Vaping Use  ? Vaping Use: Never used  ?Substance and Sexual Activity  ? Alcohol use: No  ? Drug use: No  ? Sexual activity: Yes  ?Other Topics Concern  ? Not on file  ?Social History Narrative  ? Not  on file  ? ?Social Determinants of Health  ? ?Financial Resource Strain: Low Risk   ? Difficulty of Paying Living Expenses: Not hard at all  ?Food Insecurity: No Food Insecurity  ? Worried About Programme researcher, broadcasting/film/video in the Last Year: Never true  ? Ran Out of Food in the Last Year: Never true  ?Transportation Needs: No Transportation Needs  ? Lack of Transportation (Medical): No  ? Lack of Transportation (Non-Medical): No  ?Physical Activity: Inactive  ? Days of Exercise per Week: 0 days  ? Minutes of Exercise per Session: 0 min  ?Stress: No Stress Concern Present  ? Feeling of Stress : Not at all  ?Social  Connections: Not on file  ?Intimate Partner Violence: Not on file  ? ? ?FAMILY HISTORY: ?Family History  ?Problem Relation Age of Onset  ? Glaucoma Mother   ? Hypertension Mother   ? Hypertension Father   ? Cancer Father   ? Alcohol abuse Father   ? Kidney disease Brother   ? ? ?ALLERGIES:  has No Known Allergies. ? ?MEDICATIONS:  ?Current Outpatient Medications  ?Medication Sig Dispense Refill  ? amLODipine (NORVASC) 10 MG tablet amlodipine 10 mg tablet ? Take 1 tablet every day by oral route.    ? COMBIGAN 0.2-0.5 % ophthalmic solution     ? diclofenac Sodium (VOLTAREN) 1 % GEL Apply 2 g topically 4 (four) times daily. (Patient not taking: Reported on 03/23/2022) 100 g 2  ? hydrochlorothiazide (HYDRODIURIL) 25 MG tablet Take 1 tablet by mouth once daily 90 tablet 0  ? hydrocortisone cream 1 % APPLY  CREAM EXTERNALLY TO AFFECTED AREA TWICE DAILY (Patient not taking: Reported on 03/23/2022) 29 g 0  ? hydrOXYzine (ATARAX) 25 MG tablet hydroxyzine HCl 25 mg tablet    ? ibuprofen (ADVIL) 800 MG tablet TAKE 1 TABLET BY MOUTH EVERY 8 HOURS AS NEEDED 30 tablet 0  ? LUMIGAN 0.01 % SOLN INSTILL 1 DROP INTO EACH EYE EVERY EVENING    ? ?No current facility-administered medications for this visit.  ? ? ?REVIEW OF SYSTEMS:   ?Constitutional: ( - ) fevers, ( - )  chills , ( - ) night sweats ?Eyes: ( - ) blurriness of vision, ( - ) double vision, ( - ) watery eyes ?Ears, nose, mouth, throat, and face: ( - ) mucositis, ( - ) sore throat ?Respiratory: ( - ) cough, ( - ) dyspnea, ( - ) wheezes ?Cardiovascular: ( - ) palpitation, ( - ) chest discomfort, ( - ) lower extremity swelling ?Gastrointestinal:  ( - ) nausea, ( - ) heartburn, ( - ) change in bowel habits ?Skin: ( - ) abnormal skin rashes ?Lymphatics: ( - ) new lymphadenopathy, ( - ) easy bruising ?Neurological: ( - ) numbness, ( - ) tingling, ( - ) new weaknesses ?Behavioral/Psych: ( - ) mood change, ( - ) new changes  ?All other systems were reviewed with the patient and are  negative. ? ?PHYSICAL EXAMINATION: ? ?Vitals:  ? 03/23/22 1313  ?BP: (!) 165/90  ?Pulse: 74  ?Resp: 16  ?Temp: 98.2 ?F (36.8 ?C)  ?SpO2: 100%  ? ?Filed Weights  ? 03/23/22 1313  ?Weight: 202 lb 4.8 oz (91.8 kg)  ? ? ?GENERAL: well appearing elderly African-American male in NAD  ?SKIN: Dry skin and raised rash noted on upper extremities bilaterally.  Otherwise skin color, texture, turgor are normal, no rashes or significant lesions ?EYES: conjunctiva are pink and non-injected, sclera clear ?LUNGS: clear  to auscultation and percussion with normal breathing effort ?HEART: regular rate & rhythm and no murmurs and no lower extremity edema ?Musculoskeletal: no cyanosis of digits and no clubbing  ?PSYCH: alert & oriented x 3, fluent speech ?NEURO: no focal motor/sensory deficits ? ?LABORATORY DATA:  ?I have reviewed the data as listed ? ?  Latest Ref Rng & Units 03/23/2022  ?  2:16 PM 10/25/2021  ? 11:29 AM 09/08/2021  ? 11:38 AM  ?CBC  ?WBC 4.0 - 10.5 K/uL 5.6   7.7   8.0    ?Hemoglobin 13.0 - 17.0 g/dL 16.111.5   9.0   7.5    ?Hematocrit 39.0 - 52.0 % 35.1   30.1   25.2    ?Platelets 150 - 400 K/uL 208   317.0   356    ? ? ? ?  Latest Ref Rng & Units 03/23/2022  ?  2:16 PM 09/08/2021  ? 11:38 AM 03/18/2021  ? 11:36 AM  ?CMP  ?Glucose 70 - 99 mg/dL 096170   045107   409114    ?BUN 8 - 23 mg/dL 20   17   10     ?Creatinine 0.61 - 1.24 mg/dL 8.111.11   9.141.15   7.821.17    ?Sodium 135 - 145 mmol/L 137   141   140    ?Potassium 3.5 - 5.1 mmol/L 4.1   5.0   5.1    ?Chloride 98 - 111 mmol/L 103   104   103    ?CO2 22 - 32 mmol/L 29   26   25     ?Calcium 8.9 - 10.3 mg/dL 9.2   9.6   9.9    ?Total Protein 6.5 - 8.1 g/dL 8.2   7.7   8.3    ?Total Bilirubin 0.3 - 1.2 mg/dL 0.4   0.4   0.5    ?Alkaline Phos 38 - 126 U/L 63   91   57    ?AST 15 - 41 U/L 18   21   18     ?ALT 0 - 44 U/L 14   15   11     ? ? ? ?ASSESSMENT & PLAN ?Wesley Webb 76 y.o. male with medical history significant for anemia, HTN, and cognitive decline who presents for evaluation of a  monoclonal gammopathy.  ? ?Monoclonal Gammopathies are a group of medical conditions defined by the presence of a monoclonal protein (an M protein) in the blood or urine. Monoclonal gammopathies include monocl

## 2022-03-23 ENCOUNTER — Other Ambulatory Visit: Payer: Self-pay

## 2022-03-23 ENCOUNTER — Inpatient Hospital Stay: Payer: Medicare PPO

## 2022-03-23 ENCOUNTER — Inpatient Hospital Stay: Payer: Medicare PPO | Attending: Hematology and Oncology | Admitting: Hematology and Oncology

## 2022-03-23 VITALS — BP 165/90 | HR 74 | Temp 98.2°F | Resp 16 | Wt 202.3 lb

## 2022-03-23 DIAGNOSIS — R778 Other specified abnormalities of plasma proteins: Secondary | ICD-10-CM

## 2022-03-23 DIAGNOSIS — R779 Abnormality of plasma protein, unspecified: Secondary | ICD-10-CM | POA: Insufficient documentation

## 2022-03-23 LAB — CMP (CANCER CENTER ONLY)
ALT: 14 U/L (ref 0–44)
AST: 18 U/L (ref 15–41)
Albumin: 3.3 g/dL — ABNORMAL LOW (ref 3.5–5.0)
Alkaline Phosphatase: 63 U/L (ref 38–126)
Anion gap: 5 (ref 5–15)
BUN: 20 mg/dL (ref 8–23)
CO2: 29 mmol/L (ref 22–32)
Calcium: 9.2 mg/dL (ref 8.9–10.3)
Chloride: 103 mmol/L (ref 98–111)
Creatinine: 1.11 mg/dL (ref 0.61–1.24)
GFR, Estimated: >60 mL/min (ref >60)
Glucose, Bld: 170 mg/dL — ABNORMAL HIGH (ref 70–99)
Potassium: 4.1 mmol/L (ref 3.5–5.1)
Sodium: 137 mmol/L (ref 135–145)
Total Bilirubin: 0.4 mg/dL (ref 0.3–1.2)
Total Protein: 8.2 g/dL — ABNORMAL HIGH (ref 6.5–8.1)

## 2022-03-23 LAB — CBC WITH DIFFERENTIAL (CANCER CENTER ONLY)
Abs Immature Granulocytes: 0.01 10*3/uL (ref 0.00–0.07)
Basophils Absolute: 0 10*3/uL (ref 0.0–0.1)
Basophils Relative: 0 %
Eosinophils Absolute: 0.3 10*3/uL (ref 0.0–0.5)
Eosinophils Relative: 5 %
HCT: 35.1 % — ABNORMAL LOW (ref 39.0–52.0)
Hemoglobin: 11.5 g/dL — ABNORMAL LOW (ref 13.0–17.0)
Immature Granulocytes: 0 %
Lymphocytes Relative: 24 %
Lymphs Abs: 1.3 10*3/uL (ref 0.7–4.0)
MCH: 30.3 pg (ref 26.0–34.0)
MCHC: 32.8 g/dL (ref 30.0–36.0)
MCV: 92.6 fL (ref 80.0–100.0)
Monocytes Absolute: 0.6 10*3/uL (ref 0.1–1.0)
Monocytes Relative: 11 %
Neutro Abs: 3.4 10*3/uL (ref 1.7–7.7)
Neutrophils Relative %: 60 %
Platelet Count: 208 10*3/uL (ref 150–400)
RBC: 3.79 MIL/uL — ABNORMAL LOW (ref 4.22–5.81)
RDW: 13.2 % (ref 11.5–15.5)
WBC Count: 5.6 10*3/uL (ref 4.0–10.5)
nRBC: 0 % (ref 0.0–0.2)

## 2022-03-24 ENCOUNTER — Ambulatory Visit (INDEPENDENT_AMBULATORY_CARE_PROVIDER_SITE_OTHER): Payer: Medicare PPO

## 2022-03-24 ENCOUNTER — Encounter: Payer: Self-pay | Admitting: Physician Assistant

## 2022-03-24 DIAGNOSIS — B888 Other specified infestations: Secondary | ICD-10-CM

## 2022-03-24 DIAGNOSIS — I10 Essential (primary) hypertension: Secondary | ICD-10-CM

## 2022-03-24 DIAGNOSIS — E782 Mixed hyperlipidemia: Secondary | ICD-10-CM

## 2022-03-24 LAB — KAPPA/LAMBDA LIGHT CHAINS
Kappa free light chain: 47.5 mg/L — ABNORMAL HIGH (ref 3.3–19.4)
Kappa, lambda light chain ratio: 2.51 — ABNORMAL HIGH (ref 0.26–1.65)
Lambda free light chains: 18.9 mg/L (ref 5.7–26.3)

## 2022-03-24 NOTE — Patient Instructions (Signed)
Social Worker Visit Information ? ?Goals we discussed today:  ? Goals Addressed   ? ?  ?  ?  ?  ? This Visit's Progress  ?  COMPLETED: Barriers to Treatment Identified and Managed     ?  Timeframe:  Long-Range Goal ?Priority:  High ?Start Date:   10.11.22                                          ? ? ?Patient Goals/Self-Care Activities ?patient will: with the help of his family ?-Identify appropriate family member to serve as Power of Attorney ?-Follow up with [place] as needed ?-Attend all upcomming appointments ?-Contact SW as needed  ?  ? ?  ?  ? ?Patient verbalizes understanding of instructions and care plan provided today and agrees to view in [place]. Active MyChart status confirmed with patient.   ? ?Follow Up Plan:  No follow up planned at this time. Please contact me as needed. ? ? ?Daneen Schick, BSW, CDP ?Social Worker, Certified Dementia Practitioner ?[facility] / [place] [facility] ?[phone] ? ?  ' ?

## 2022-03-24 NOTE — Chronic Care Management (AMB) (Signed)
?Chronic Care Management  ? ? Social Work Note ? ?03/24/2022 ?Name: Wesley Webb MRN: [id number] DOB: 1946-05-19 ? ?Wesley Webb is a 76 y.o. year old male who is a primary care patient of Arnette Felts, FNP. The CCM team was consulted to assist the patient with chronic disease management and/or care coordination needs related to:  HTN, Mixed Hyperlipidemia .  ? ?Engaged with patients daughter [name] by phone  for follow up visit in response to provider referral for social work chronic care management and care coordination services.  ? ?Consent to Services:  ?The patient was given information about Chronic Care Management services, agreed to services, and gave verbal consent prior to initiation of services.  Please see initial visit note for detailed documentation.  ? ?Patient agreed to services and consent obtained.  ? ?Assessment: Review of patient past medical history, allergies, medications, and health status, including review of relevant consultants reports was performed today as part of a comprehensive evaluation and provision of chronic care management and care coordination services.    ? ?SDOH (Social Determinants of Health) assessments and interventions performed:   ? ?Advanced Directives Status: Not addressed in this encounter. ? ?CCM Care Plan ? ?No Known Allergies ? ?Outpatient Encounter Medications as of 03/24/2022  ?Medication Sig  ? amLODipine (NORVASC) 10 MG tablet amlodipine 10 mg tablet ? Take 1 tablet every day by oral route.  ? COMBIGAN 0.2-0.5 % ophthalmic solution   ? diclofenac Sodium (VOLTAREN) 1 % GEL Apply 2 g topically 4 (four) times daily. (Patient not taking: Reported on 03/23/2022)  ? hydrochlorothiazide (HYDRODIURIL) 25 MG tablet Take 1 tablet by mouth once daily  ? hydrocortisone cream 1 % APPLY  CREAM EXTERNALLY TO AFFECTED AREA TWICE DAILY (Patient not taking: Reported on 03/23/2022)  ? hydrOXYzine (ATARAX) 25 MG tablet hydroxyzine HCl 25 mg tablet  ? ibuprofen (ADVIL) 800 MG  tablet TAKE 1 TABLET BY MOUTH EVERY 8 HOURS AS NEEDED  ? LUMIGAN 0.01 % SOLN INSTILL 1 DROP INTO EACH EYE EVERY EVENING  ? ?No facility-administered encounter medications on file as of 03/24/2022.  ? ? ?Patient Active Problem List  ? Diagnosis Date Noted  ? Urinary urgency 05/30/2019  ? Mixed hyperlipidemia 09/17/2018  ? Essential hypertension 09/17/2018  ? ? ?Conditions to be addressed/monitored: HTN and HLD;  Bed bug infestation ? ?Care Plan : Social Work [facility] Care Plan  ?Updates made by Bevelyn Ngo since 03/24/2022 12:00 AM  ?Completed 03/24/2022  ? ?Problem: Barriers to Treatment Resolved 03/24/2022  ?  ? ?Long-Range Goal: Barriers to Treatment Identified and Managed Completed 03/24/2022  ?Start Date: 10/05/2021  ?Recent Progress: On track  ?Priority: High  ?Note:   ?Current Barriers:  ?Chronic disease management support and education needs related to  HTN, Mixed Hyperlipidemia, Prediabetes   ?Bed bug infestation   ?Cognitive decline ?Weight loss of 10 pounds or more in last 6 months ? ?Social Worker Clinical Goal(s):  ?patient will work with SW to identify and address any acute and/or chronic care coordination needs related to the self health management of  HTN, Mixed Hyperlipidemia, and Prediabetes   ?patient will work with SW to address concerns related to bed bug infestation ?Patient will engage with RN Care Manager to develop an individualized plan of care to address disease management needs ? ?SW Interventions:  ?Inter-disciplinary care team collaboration (see longitudinal plan of care) ?Collaboration with Arnette Felts, FNP regarding development and update of comprehensive plan of care as evidenced by provider attestation and  co-signature ?Telephonic visit completed with patients daughter Mathayus Stanbery to assess for care coordination needs ?Determined the patient is doing well and accepting help from family. Patient recently seen by cancer center to assess reason for weight loss ?Discussed plan for SW to  close current goal with option for family to contact SW as needed with future care coordination needs ? ?Patient Goals/Self-Care Activities ?patient will: with the help of his family ?-Identify appropriate family member to serve as Power of Attorney ?-Follow up with Cancer Center as needed ?-Attend all upcomming appointments ?-Contact SW as needed  ? ? ?  ?  ? ?Follow Up Plan:  No SW follow up planned at this time. The family will contact SW as needed. ?     ?Bevelyn Ngo, BSW, CDP ?Social Worker, Certified Dementia Practitioner ?[facility] Care Management ?[phone] ? ?   ? ? ? ? ?

## 2022-03-25 DIAGNOSIS — E782 Mixed hyperlipidemia: Secondary | ICD-10-CM

## 2022-03-25 DIAGNOSIS — I10 Essential (primary) hypertension: Secondary | ICD-10-CM

## 2022-03-28 LAB — MULTIPLE MYELOMA PANEL, SERUM
Albumin SerPl Elph-Mcnc: 3.1 g/dL (ref 2.9–4.4)
Albumin/Glob SerPl: 0.8 (ref 0.7–1.7)
Alpha 1: 0.2 g/dL (ref 0.0–0.4)
Alpha2 Glob SerPl Elph-Mcnc: 0.8 g/dL (ref 0.4–1.0)
B-Globulin SerPl Elph-Mcnc: 1.1 g/dL (ref 0.7–1.3)
Gamma Glob SerPl Elph-Mcnc: 2.2 g/dL — ABNORMAL HIGH (ref 0.4–1.8)
Globulin, Total: 4.4 g/dL — ABNORMAL HIGH (ref 2.2–3.9)
IgA: 356 mg/dL (ref 61–437)
IgG (Immunoglobin G), Serum: 2246 mg/dL — ABNORMAL HIGH (ref 603–1613)
IgM (Immunoglobulin M), Srm: 68 mg/dL (ref 15–143)
Total Protein ELP: 7.5 g/dL (ref 6.0–8.5)

## 2022-03-29 ENCOUNTER — Telehealth: Payer: Medicare PPO

## 2022-03-29 ENCOUNTER — Ambulatory Visit (INDEPENDENT_AMBULATORY_CARE_PROVIDER_SITE_OTHER): Payer: Medicare PPO

## 2022-03-29 DIAGNOSIS — R413 Other amnesia: Secondary | ICD-10-CM

## 2022-03-29 DIAGNOSIS — E782 Mixed hyperlipidemia: Secondary | ICD-10-CM

## 2022-03-29 DIAGNOSIS — I10 Essential (primary) hypertension: Secondary | ICD-10-CM

## 2022-03-29 DIAGNOSIS — B888 Other specified infestations: Secondary | ICD-10-CM

## 2022-03-29 DIAGNOSIS — R7303 Prediabetes: Secondary | ICD-10-CM

## 2022-03-29 NOTE — Chronic Care Management (AMB) (Signed)
?  Care Management  ? ?Follow Up Note ? ? ?03/29/2022 ?Name: Marcquis Ridlon MRN: [id number] DOB: [birth year] ? ? ?Referred by: Arnette Felts, FNP ?Reason for referral : Chronic Care Management (RN CM Follow up call #3) ? ? ?Third unsuccessful telephone outreach was attempted today. The patient was referred to the case management team for assistance with care management and care coordination. The patient's primary care provider has been notified of our unsuccessful attempts to make or maintain contact with the patient. The care management team is pleased to engage with this patient at any time in the future should he/she be interested in assistance from the care management team.  ? ?Follow Up Plan: We have been unable to make contact with the patient for follow up. The care management team is available to follow up with the patient after provider conversation with the patient regarding recommendation for care management engagement and subsequent re-referral to the care management team.  ? ?Delsa Sale, RN, BSN, CCM ?Care Management Coordinator ?[facility] Care Management/[facility]  ?Direct Phone: [phone] ? ? ?

## 2022-04-04 ENCOUNTER — Telehealth: Payer: Self-pay | Admitting: *Deleted

## 2022-04-04 NOTE — Telephone Encounter (Signed)
-----   Message from Jaci Standard, MD sent at 04/02/2022  3:21 PM EDT ----- ?Please let Wesley Webb know that there is no evidence of a monoclonal gammopathy in his blood.  We do still require a metastatic survey as well as his UPEP to be trended.  Once these results are available we will be able to say for certain if his findings are consistent with a monoclonal gammopathy.  ? ?

## 2022-04-04 NOTE — Telephone Encounter (Signed)
TCT patient regarding recent lab results.  Spoke with pt's daughter, [name].  She states she is the main point of contact. She states her father has some dementia/memory issues. Advised that his labs came back fine from 03/23/22. No evidence abnormal protein in his blood. Advised that  he will need to complete a 24 hour urine and have the metastatic survey done to complete the work up. Explained what would need to be done for the 24 hour urine and the xrays. [name] will check to see if he has the 24 hour urine container at home. If she cannot find it, she will comeback to the clinic to pick up another one. ?All questions answered to her satisfaction. ?

## 2022-04-05 ENCOUNTER — Other Ambulatory Visit: Payer: Self-pay | Admitting: Nurse Practitioner

## 2022-04-05 DIAGNOSIS — R609 Edema, unspecified: Secondary | ICD-10-CM

## 2022-04-05 DIAGNOSIS — I10 Essential (primary) hypertension: Secondary | ICD-10-CM

## 2022-04-20 ENCOUNTER — Ambulatory Visit: Payer: Medicare PPO

## 2022-04-20 ENCOUNTER — Telehealth: Payer: Self-pay

## 2022-04-20 NOTE — Telephone Encounter (Signed)
This nurse attempted to call patient for telephonic AWV. Daughter [name] answered and said that she was in the middle of a presentation then hung up. ?

## 2022-04-24 DIAGNOSIS — I10 Essential (primary) hypertension: Secondary | ICD-10-CM

## 2022-04-24 DIAGNOSIS — E782 Mixed hyperlipidemia: Secondary | ICD-10-CM

## 2022-04-25 ENCOUNTER — Other Ambulatory Visit (INDEPENDENT_AMBULATORY_CARE_PROVIDER_SITE_OTHER): Payer: Medicare PPO

## 2022-04-25 ENCOUNTER — Ambulatory Visit: Payer: Medicare PPO | Admitting: Physician Assistant

## 2022-04-25 ENCOUNTER — Encounter: Payer: Self-pay | Admitting: Physician Assistant

## 2022-04-25 VITALS — BP 120/80 | HR 83 | Ht 70.0 in | Wt 214.0 lb

## 2022-04-25 DIAGNOSIS — R195 Other fecal abnormalities: Secondary | ICD-10-CM | POA: Diagnosis not present

## 2022-04-25 DIAGNOSIS — D509 Iron deficiency anemia, unspecified: Secondary | ICD-10-CM

## 2022-04-25 LAB — CBC WITH DIFFERENTIAL/PLATELET
Basophils Absolute: 0 10*3/uL (ref 0.0–0.1)
Basophils Relative: 0.4 % (ref 0.0–3.0)
Eosinophils Absolute: 0.4 10*3/uL (ref 0.0–0.7)
Eosinophils Relative: 5.8 % — ABNORMAL HIGH (ref 0.0–5.0)
HCT: 35 % — ABNORMAL LOW (ref 39.0–52.0)
Hemoglobin: 11.9 g/dL — ABNORMAL LOW (ref 13.0–17.0)
Lymphocytes Relative: 24.3 % (ref 12.0–46.0)
Lymphs Abs: 1.5 10*3/uL (ref 0.7–4.0)
MCHC: 34.1 g/dL (ref 30.0–36.0)
MCV: 93 fl (ref 78.0–100.0)
Monocytes Absolute: 0.6 10*3/uL (ref 0.1–1.0)
Monocytes Relative: 10.1 % (ref 3.0–12.0)
Neutro Abs: 3.6 10*3/uL (ref 1.4–7.7)
Neutrophils Relative %: 59.4 % (ref 43.0–77.0)
Platelets: 213 10*3/uL (ref 150.0–400.0)
RBC: 3.77 Mil/uL — ABNORMAL LOW (ref 4.22–5.81)
RDW: 13.6 % (ref 11.5–15.5)
WBC: 6.1 10*3/uL (ref 4.0–10.5)

## 2022-04-25 LAB — FERRITIN: Ferritin: 65.8 ng/mL (ref 22.0–322.0)

## 2022-04-25 MED ORDER — CLENPIQ 10-3.5-12 MG-GM -GM/160ML PO SOLN: 1.0000 | ORAL | 0 refills | Status: DC

## 2022-04-25 NOTE — Progress Notes (Signed)
? ?Subjective:  ? ? Patient ID: Wesley Webb, male    DOB: October 14, 1946, 76 y.o.   MRN: [id number] ? ?HPI ?Wesley Webb is a pleasant 76 year old African-American male, established with Dr. Silverio Decamp.  He was initially seen here in consultation on 10/25/2021 for evaluation of iron deficiency anemia.  He had had labs done in September 2022 that showed hemoglobin of 7.5/hematocrit of 25.2.  At that point he had not done Hemoccults or iron studies.  Subsequent iron studies showed serum iron of 119/sat 26.9/TIBC of 442, and ferritin 11.3. ?Repeat hemoglobin October 2022 with hemoglobin up to 9.0. ?He had not had any prior GI evaluation.  He was scheduled to undergo colonoscopy and EGD.  And after the iron studies returned was advised to take ferrous sulfate 325 mg twice daily. ?Unfortunately his procedures were canceled as his family member was ill with COVID on the day of his procedure. ?He comes back in today with family to for reassessment. ?He says he has been feeling fine, has no complaints of heartburn or indigestion, no abdominal pain, no nausea, appetite has been good, no changes in bowel habits, no melena or hematochezia. ?He did have evaluation by his new PCP/Scott Long, PA-C recently and was noted to be heme positive. ?Most recent labs done 03/23/2022 hemoglobin 11.5/hematocrit 35.1/MCV of 92. ?His med list does have Advil listed up to 3 times daily, and he says that he has not been taking the iron on a daily basis. ? ?Other medical problems include hypertension, hyperlipidemia, and cognitive decline, chronic low back pain.  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 ? ?Review of Systems.Pertinent positive and negative review of systems were noted in the above HPI section.  All other review of systems was otherwise negative.  ? ?Outpatient Encounter Medications as of 04/25/2022  ?Medication Sig  ? amLODipine (NORVASC) 10 MG tablet  amlodipine 10 mg tablet ? Take 1 tablet every day by oral route.  ? COMBIGAN 0.2-0.5 % ophthalmic solution   ? diclofenac Sodium (VOLTAREN) 1 % GEL Apply 2 g topically 4 (four) times daily.  ? hydrochlorothiazide (HYDRODIURIL) 25 MG tablet Take 1 tablet by mouth once daily  ? hydrocortisone cream 1 % APPLY  CREAM EXTERNALLY TO AFFECTED AREA TWICE DAILY  ? hydrOXYzine (ATARAX) 25 MG tablet hydroxyzine HCl 25 mg tablet  ? ibuprofen (ADVIL) 800 MG tablet TAKE 1 TABLET BY MOUTH EVERY 8 HOURS AS NEEDED  ? LUMIGAN 0.01 % SOLN INSTILL 1 DROP INTO EACH EYE EVERY EVENING  ? Sod Picosulfate-Mag Ox-Cit Acd (CLENPIQ) 10-3.5-12 MG-GM -GM/160ML SOLN Take 1 kit by mouth as directed.  ? ?No facility-administered encounter medications on file as of 04/25/2022.  ? ?No Known Allergies ?Patient Active Problem List  ? Diagnosis Date Noted  ? Urinary urgency 05/30/2019  ? Mixed hyperlipidemia 09/17/2018  ? Essential hypertension 09/17/2018  ? ?Social History  ? ?Socioeconomic History  ? Marital status: Married  ?  Spouse name: Not on file  ? Number of children: Not on file  ? Years of education: Not on file  ? Highest education level: Not on file  ?Occupational History  ? Occupation: works part Airline pilot  ?Tobacco Use  ? Smoking status: Never  ? Smokeless tobacco: Never  ?Vaping Use  ? Vaping Use: Never used  ?Substance and Sexual Activity  ? Alcohol use: No  ? Drug use: No  ? Sexual activity: Yes  ?Other Topics Concern  ?  Not on file  ?Social History Narrative  ? Not on file  ? ?Social Determinants of Health  ? ?Financial Resource Strain: Not on file  ?Food Insecurity: No Food Insecurity  ? Worried About Charity fundraiser in the Last Year: Never true  ? Ran Out of Food in the Last Year: Never true  ?Transportation Needs: No Transportation Needs  ? Lack of Transportation (Medical): No  ? Lack of Transportation (Non-Medical): No  ?Physical Activity: Not on file  ?Stress: Not on file  ?Social Connections: Not on file  ?Intimate Partner  Violence: Not on file  ? ? ?Wesley Webb family history includes Alcohol abuse in his father; Cancer in his father; Glaucoma in his mother; Hypertension in his father and mother; Kidney disease in his brother. ? ? ?   ?Objective:  ?  ?Vitals:  ? 04/25/22 1401  ?BP: 120/80  ?Pulse: 83  ? ? ?Physical Exam Well-developed well-nourished elderly African-American male in no acute distress.  Accompanied by his son-in-law  Weight, weight 214 BMI BMI 30.7 ? ?HEENT; nontraumatic normocephalic, EOMI, PE R LA, sclera anicteric. ?Oropharynx; not examined today ?Neck; supple, no JVD ?Cardiovascular; regular rate and rhythm with S1-S2, no murmur rub or gallop ?Pulmonary; Clear bilaterally ?Abdomen; soft, nontender, nondistended, no palpable mass or hepatosplenomegaly, bowel sounds are active ?Rectal; not done today ?Skin; benign exam, no jaundice rash or appreciable lesions ?Extremities; no clubbing cyanosis or edema skin warm and dry ?Neuro/Psych; alert and oriented x4, grossly nonfocal mood and affect appropriate  ? ? ? ?   ?Assessment & Plan:  ? ?#73 76 year old African-American male with iron deficiency anemia, and heme positive stool. ?HGB  has actually improved over the past several months and most recent check was 11.5. ?Hemoglobin nadir 7.5 in September 2022. ? ?Patient had been scheduled for endoscopy and colonoscopy with Dr. Silverio Decamp after he was seen here last fall, however procedures had to be canceled due to family illness, ? ?Patient is currently asymptomatic ? ?Etiology of the iron deficiency anemia and heme positive stool is not clear, will need to rule out occult neoplasm, AVMs, gastropathy, peptic ulcer disease. ? ?#2 hypertension ?#3 hyperlipidemia ?#4 memory loss/cognitive decline ?#5 BPH ?#6.  Chronic low back pain ? ?Plan; we will repeat CBC and ferritin today ?Patient will be scheduled for colonoscopy and upper endoscopy with Dr. Silverio Decamp  Both procedures were discussed in detail with the patient and  son-in-law including indications risk and benefits and they are agreeable to proceed ?If he is using NSAIDs regularly, would be beneficial to decrease is much as possible. ?We will decide on need for ongoing iron supplementation based on today's labs. ?Further recommendations pending findings at endoscopic evaluation ? ? ? ?Fatumata Kashani S Macsen Nuttall PA-C ?04/25/2022 ? ? ?Cc: Minette Brine, FNP ?  ?

## 2022-04-25 NOTE — Patient Instructions (Signed)
If you are age 75 or older, your body mass index should be between 23-30. Your Body mass index is 30.71 kg/m?Marland Kitchen If this is out of the aforementioned range listed, please consider follow up with your Primary Care Provider. ?________________________________________________________ ? ?The [facility] providers would like to encourage you to use [facility] to communicate with providers for non-urgent requests or questions.  Due to long hold times on the telephone, sending your provider a message by [facility] may be a faster and more efficient way to get a response.  Please allow 48 business hours for a response.  Please remember that this is for non-urgent requests.  ?_______________________________________________________ ? ?Your provider has requested that you go to the basement level for lab work before leaving today. Press "B" on the elevator. The lab is located at the first door on the left as you exit the elevator. ? ?You have been scheduled for an endoscopy and colonoscopy. Please follow the written instructions given to you at your visit today. ?Please pick up your prep supplies at the pharmacy within the next 1-3 days. ?If you use inhalers (even only as needed), please bring them with you on the day of your procedure. ? ?Follow up as needed. ? ?Thank you for entrusting me with your care and choosing [facility]. ? ?Nicoletta Ba, PA-C ?

## 2022-04-26 ENCOUNTER — Encounter: Payer: Self-pay | Admitting: Nurse Practitioner

## 2022-04-27 ENCOUNTER — Telehealth: Payer: Self-pay | Admitting: Physician Assistant

## 2022-04-27 MED ORDER — NA SULFATE-K SULFATE-MG SULF 17.5-3.13-1.6 GM/177ML PO SOLN: 1.0000 | Freq: Once | ORAL | 0 refills | Status: AC

## 2022-04-27 NOTE — Telephone Encounter (Signed)
Received a call from patient's son.  They went to pharmacy to pick up prep and it is too expensive.  Pharmacist suggested Suprep as a less expensive alternative.  Please call patient's son and advise. ? ?Thank you. ?

## 2022-04-27 NOTE — Telephone Encounter (Signed)
Called and spoke with patient's son. Suprep has been sent to pharmacy. New instructions will be mailed to the patient son. ?

## 2022-04-28 NOTE — Telephone Encounter (Signed)
New directions have been typed up and will be sent to patient's son. ?

## 2022-05-03 ENCOUNTER — Telehealth: Payer: Self-pay | Admitting: Physician Assistant

## 2022-05-03 NOTE — Telephone Encounter (Signed)
I spoke with patient daughter Marisue Ivan to reschedule awv.  She stated patient is seeing another pcp Scott long. ? ? ?

## 2022-05-19 NOTE — Progress Notes (Signed)
Reviewed and agree with documentation and assessment and plan. K. Veena Myndi Wamble , MD   

## 2022-06-06 ENCOUNTER — Encounter: Payer: Self-pay | Admitting: Gastroenterology

## 2022-06-13 ENCOUNTER — Encounter: Payer: Self-pay | Admitting: Gastroenterology

## 2022-06-13 ENCOUNTER — Ambulatory Visit: Payer: Medicare PPO | Admitting: Gastroenterology

## 2022-06-13 VITALS — BP 120/68 | HR 56 | Temp 98.7°F | Ht 70.0 in | Wt 214.0 lb

## 2022-06-13 DIAGNOSIS — R195 Other fecal abnormalities: Secondary | ICD-10-CM

## 2022-06-13 DIAGNOSIS — D509 Iron deficiency anemia, unspecified: Secondary | ICD-10-CM

## 2022-06-13 MED ORDER — SODIUM CHLORIDE 0.9 % IV SOLN: 500.0000 mL | Freq: Once | INTRAVENOUS | Status: AC

## 2022-06-13 NOTE — Progress Notes (Signed)
I have reviewed the patient's medical history in detail and updated the computerized patient record.

## 2022-06-13 NOTE — Progress Notes (Signed)
Patient has active bed bug infestation, unable to proceed with procedures at [facility] due to inability to ensure safety and prevent cross contamination of procedure area. Plan to reschedule procedure at [facility] endoscopy unit. Schedule at 8:30 AM on Wednesday 06/15/22

## 2022-06-14 ENCOUNTER — Telehealth: Payer: Self-pay

## 2022-06-14 ENCOUNTER — Other Ambulatory Visit: Payer: Self-pay

## 2022-06-14 ENCOUNTER — Telehealth: Payer: Self-pay | Admitting: Gastroenterology

## 2022-06-14 NOTE — Telephone Encounter (Signed)
Called the patient to give updated information regarding the colonoscopy appointment. No answer. Left a brief message about moving the procedure to 06/16/22. Called daughter listed on DPR, Slayde Brault. Advised of the change to Thursday.  Arrive at 12:30 pm.

## 2022-06-14 NOTE — Telephone Encounter (Signed)
ERROR

## 2022-06-14 NOTE — Telephone Encounter (Signed)
Spoke with the patient family member. The patient started drinking his prep today. He was confused on when to start. New sample obtained for family to pick up on the 2nd floor tomorrow. New instructions printed and will be with the sample. Agrees to the new date of 06/16/22 and arrival time of 12:30 pm. Explained the reason for the change. Expresses understanding.

## 2022-06-16 ENCOUNTER — Ambulatory Visit (HOSPITAL_COMMUNITY): Payer: Medicare PPO | Admitting: Anesthesiology

## 2022-06-16 ENCOUNTER — Ambulatory Visit (HOSPITAL_BASED_OUTPATIENT_CLINIC_OR_DEPARTMENT_OTHER): Payer: Medicare PPO | Admitting: Anesthesiology

## 2022-06-16 ENCOUNTER — Encounter (HOSPITAL_COMMUNITY): Payer: Self-pay | Admitting: Gastroenterology

## 2022-06-16 ENCOUNTER — Ambulatory Visit (HOSPITAL_COMMUNITY)
Admission: RE | Admit: 2022-06-16 | Discharge: 2022-06-16 | Disposition: A | Discharge status: Home / Self Care | Payer: Medicare PPO | Source: Ambulatory Visit | Attending: Gastroenterology | Admitting: Gastroenterology

## 2022-06-16 ENCOUNTER — Other Ambulatory Visit: Payer: Self-pay

## 2022-06-16 ENCOUNTER — Encounter (HOSPITAL_COMMUNITY)
Admission: RE | Disposition: A | Discharge status: Home / Self Care | Payer: Self-pay | Source: Ambulatory Visit | Attending: Gastroenterology

## 2022-06-16 DIAGNOSIS — R195 Other fecal abnormalities: Secondary | ICD-10-CM | POA: Diagnosis not present

## 2022-06-16 DIAGNOSIS — R4189 Other symptoms and signs involving cognitive functions and awareness: Secondary | ICD-10-CM | POA: Diagnosis not present

## 2022-06-16 DIAGNOSIS — K648 Other hemorrhoids: Secondary | ICD-10-CM | POA: Diagnosis not present

## 2022-06-16 DIAGNOSIS — K3189 Other diseases of stomach and duodenum: Secondary | ICD-10-CM | POA: Insufficient documentation

## 2022-06-16 DIAGNOSIS — K2100 Gastro-esophageal reflux disease with esophagitis, without bleeding: Secondary | ICD-10-CM

## 2022-06-16 DIAGNOSIS — D500 Iron deficiency anemia secondary to blood loss (chronic): Secondary | ICD-10-CM

## 2022-06-16 DIAGNOSIS — K449 Diaphragmatic hernia without obstruction or gangrene: Secondary | ICD-10-CM | POA: Insufficient documentation

## 2022-06-16 DIAGNOSIS — K2950 Unspecified chronic gastritis without bleeding: Secondary | ICD-10-CM | POA: Diagnosis not present

## 2022-06-16 DIAGNOSIS — K922 Gastrointestinal hemorrhage, unspecified: Secondary | ICD-10-CM | POA: Diagnosis present

## 2022-06-16 DIAGNOSIS — A048 Other specified bacterial intestinal infections: Secondary | ICD-10-CM | POA: Diagnosis not present

## 2022-06-16 DIAGNOSIS — D509 Iron deficiency anemia, unspecified: Secondary | ICD-10-CM | POA: Diagnosis not present

## 2022-06-16 DIAGNOSIS — K2101 Gastro-esophageal reflux disease with esophagitis, with bleeding: Secondary | ICD-10-CM | POA: Diagnosis not present

## 2022-06-16 DIAGNOSIS — I671 Cerebral aneurysm, nonruptured: Secondary | ICD-10-CM | POA: Insufficient documentation

## 2022-06-16 DIAGNOSIS — K2971 Gastritis, unspecified, with bleeding: Secondary | ICD-10-CM

## 2022-06-16 DIAGNOSIS — I10 Essential (primary) hypertension: Secondary | ICD-10-CM | POA: Diagnosis not present

## 2022-06-16 DIAGNOSIS — K644 Residual hemorrhoidal skin tags: Secondary | ICD-10-CM | POA: Diagnosis not present

## 2022-06-16 DIAGNOSIS — K2970 Gastritis, unspecified, without bleeding: Secondary | ICD-10-CM

## 2022-06-16 HISTORY — PX: ESOPHAGOGASTRODUODENOSCOPY (EGD) WITH PROPOFOL: SHX5813

## 2022-06-16 HISTORY — PX: BIOPSY: SHX5522

## 2022-06-16 HISTORY — PX: COLONOSCOPY WITH PROPOFOL: SHX5780

## 2022-06-16 SURGERY — ESOPHAGOGASTRODUODENOSCOPY (EGD) WITH PROPOFOL
Anesthesia: Monitor Anesthesia Care

## 2022-06-16 MED ORDER — PHENYLEPHRINE HCL (PRESSORS) 10 MG/ML IV SOLN
INTRAVENOUS | Status: DC | PRN
  Administered 2022-06-16: 80 ug via INTRAVENOUS
  Administered 2022-06-16: 40 ug via INTRAVENOUS

## 2022-06-16 MED ORDER — ALBUMIN HUMAN 5 % IV SOLN: INTRAVENOUS | Status: DC | PRN

## 2022-06-16 MED ORDER — EPHEDRINE SULFATE (PRESSORS) 50 MG/ML IJ SOLN
INTRAMUSCULAR | Status: DC | PRN
  Administered 2022-06-16 (×2): 10 mg via INTRAVENOUS

## 2022-06-16 MED ORDER — LACTATED RINGERS IV SOLN: INTRAVENOUS | Status: DC

## 2022-06-16 MED ORDER — ALBUMIN HUMAN 5 % IV SOLN
INTRAVENOUS | Status: AC
  Filled 2022-06-16: qty 250

## 2022-06-16 MED ORDER — PROPOFOL 500 MG/50ML IV EMUL
INTRAVENOUS | Status: DC | PRN
  Administered 2022-06-16: 120 ug/kg/min via INTRAVENOUS

## 2022-06-16 MED ORDER — ATROPINE SULFATE 0.4 MG/ML IV SOLN
INTRAVENOUS | Status: DC | PRN
  Administered 2022-06-16: .4 mg via INTRAVENOUS

## 2022-06-16 MED ORDER — DEXMEDETOMIDINE (PRECEDEX) IN NS 20 MCG/5ML (4 MCG/ML) IV SYRINGE
PREFILLED_SYRINGE | INTRAVENOUS | Status: DC | PRN
  Administered 2022-06-16: 8 ug via INTRAVENOUS
  Administered 2022-06-16 (×2): 4 ug via INTRAVENOUS

## 2022-06-16 MED ORDER — PROPOFOL 10 MG/ML IV BOLUS
INTRAVENOUS | Status: DC | PRN
  Administered 2022-06-16: 20 mg via INTRAVENOUS
  Administered 2022-06-16 (×2): 10 mg via INTRAVENOUS

## 2022-06-16 MED ORDER — PROPOFOL 1000 MG/100ML IV EMUL
INTRAVENOUS | Status: AC
  Filled 2022-06-16: qty 100

## 2022-06-16 MED ORDER — PANTOPRAZOLE SODIUM 40 MG PO TBEC: 40.0000 mg | DELAYED_RELEASE_TABLET | Freq: Every day | ORAL | 3 refills | Status: DC

## 2022-06-16 MED ORDER — LIDOCAINE HCL 1 % IJ SOLN
INTRAMUSCULAR | Status: DC | PRN
  Administered 2022-06-16: 50 mg via INTRADERMAL

## 2022-06-16 MED ORDER — DEXMEDETOMIDINE HCL IN NACL 80 MCG/20ML IV SOLN
INTRAVENOUS | Status: AC
  Filled 2022-06-16: qty 20

## 2022-06-16 SURGICAL SUPPLY — 25 items

## 2022-06-16 NOTE — Discharge Instructions (Signed)
YOU HAD AN ENDOSCOPIC PROCEDURE TODAY: Refer to the procedure report and other information in the discharge instructions given to you for any specific questions about what was found during the examination. If this information does not answer your questions, please call [place] office at [phone] to clarify.  ° °YOU SHOULD EXPECT: Some feelings of bloating in the abdomen. Passage of more gas than usual. Walking can help get rid of the air that was put into your GI tract during the procedure and reduce the bloating. If you had a lower endoscopy (such as a colonoscopy or flexible sigmoidoscopy) you may notice spotting of blood in your stool or on the toilet paper. Some abdominal soreness may be present for a day or two, also. ° °DIET: Your first meal following the procedure should be a light meal and then it is ok to progress to your normal diet. A half-sandwich or bowl of soup is an example of a good first meal. Heavy or fried foods are harder to digest and may make you feel nauseous or bloated. Drink plenty of fluids but you should avoid alcoholic beverages for 24 hours. If you had a esophageal dilation, please see attached instructions for diet.   ° °ACTIVITY: Your care partner should take you home directly after the procedure. You should plan to take it easy, moving slowly for the rest of the day. You can resume normal activity the day after the procedure however YOU SHOULD NOT DRIVE, use power tools, machinery or perform tasks that involve climbing or major physical exertion for 24 hours (because of the sedation medicines used during the test).  ° °SYMPTOMS TO REPORT IMMEDIATELY: °A gastroenterologist can be reached at any hour. Please call [phone]  for any of the following symptoms:  °Following lower endoscopy (colonoscopy, flexible sigmoidoscopy) °Excessive amounts of blood in the stool  °Significant tenderness, worsening of abdominal pains  °Swelling of the abdomen that is new, acute  °Fever of 100° or  higher  °Following upper endoscopy (EGD, EUS, ERCP, esophageal dilation) °Vomiting of blood or coffee ground material  °New, significant abdominal pain  °New, significant chest pain or pain under the shoulder blades  °Painful or persistently difficult swallowing  °New shortness of breath  °Black, tarry-looking or red, bloody stools ° °FOLLOW UP:  °If any biopsies were taken you will be contacted by phone or by letter within the next 1-3 weeks. Call [phone]  if you have not heard about the biopsies in 3 weeks.  °Please also call with any specific questions about appointments or follow up tests. ° °

## 2022-06-16 NOTE — Anesthesia Postprocedure Evaluation (Signed)
Anesthesia Post Note  Patient: Wesley Webb  Procedure(s) Performed: ESOPHAGOGASTRODUODENOSCOPY (EGD) WITH PROPOFOL COLONOSCOPY WITH PROPOFOL BIOPSY     Patient location during evaluation: Endoscopy Anesthesia Type: MAC Level of consciousness: awake and alert Pain management: pain level controlled Vital Signs Assessment: post-procedure vital signs reviewed and stable Respiratory status: spontaneous breathing, nonlabored ventilation, respiratory function stable and patient connected to nasal cannula oxygen Cardiovascular status: stable and blood pressure returned to baseline Postop Assessment: no apparent nausea or vomiting Anesthetic complications: no   No notable events documented.  Last Vitals:  Vitals:   06/16/22 1332  BP: 139/79  Pulse: (!) 54  Resp: 16  Temp: (!) 36.3 C  SpO2: 98%    Last Pain:  Vitals:   06/16/22 1332  TempSrc: Temporal  PainSc: 0-No pain                 Barnet Glasgow

## 2022-06-16 NOTE — Op Note (Signed)
[facility] Patient Name: Wesley Webb Procedure Date: 06/16/2022 MRN: [id number] Attending MD: Napoleon Form , MD Date of Birth: 1946-07-12 CSN: [id number] Age: 76 Admit Type: Outpatient Procedure:                Upper GI endoscopy Indications:              Gastrointestinal bleeding of unknown origin,                            Suspected upper gastrointestinal bleeding in                            patient with unexplained iron deficiency anemia Providers:                Napoleon Form, MD, Lorenza Evangelist, RN, Beryle Beams, Technician, Marlena Clipper, CRNA Referring MD:              Medicines:                Monitored Anesthesia Care Complications:            No immediate complications. Estimated Blood Loss:     Estimated blood loss was minimal. Procedure:                Pre-Anesthesia Assessment:                           - Prior to the procedure, a History and Physical                            was performed, and patient medications and                            allergies were reviewed. The patient's tolerance of                            previous anesthesia was also reviewed. The risks                            and benefits of the procedure and the sedation                            options and risks were discussed with the patient.                            All questions were answered, and informed consent                            was obtained. Prior Anticoagulants: The patient has                            taken no previous anticoagulant or antiplatelet  agents. ASA Grade Assessment: III - A patient with                            severe systemic disease. After reviewing the risks                            and benefits, the patient was deemed in                            satisfactory condition to undergo the procedure.                           After obtaining informed consent, the  endoscope was                            passed under direct vision. Throughout the                            procedure, the patient's blood pressure, pulse, and                            oxygen saturations were monitored continuously. The                            [id number]-H190 ([id number]) Olympus endoscope was introduced                            through the mouth, and advanced to the second part                            of duodenum. The upper GI endoscopy was                            accomplished without difficulty. The patient                            tolerated the procedure well. Scope In: Scope Out: Findings:      LA Grade B (one or more mucosal breaks greater than 5 mm, not extending       between the tops of two mucosal folds) esophagitis with no bleeding was       found 38 to 39 cm from the incisors.      A 2 cm hiatal hernia was present.      No other gross lesions were noted in the entire esophagus.      Patchy mild inflammation characterized by congestion (edema), erosions       and erythema was found in the entire examined stomach. Biopsies were       taken with a cold forceps for Helicobacter pylori testing.      Patchy mildly erythematous mucosa without active bleeding and with no       stigmata of bleeding was found in the duodenal bulb.      Otherwise the examined duodenum was normal. Impression:               - LA Grade B reflux esophagitis  with no bleeding.                           - 2 cm hiatal hernia.                           - No gross lesions in esophagus.                           - Gastritis. Biopsied.                           - Erythematous duodenopathy.                           - Normal examined duodenum. Moderate Sedation:      Not Applicable - Patient had care per Anesthesia. Recommendation:           - Patient has a contact number available for                            emergencies. The signs and symptoms of potential                             delayed complications were discussed with the                            patient. Return to normal activities tomorrow.                            Written discharge instructions were provided to the                            patient.                           - Resume previous diet.                           - Continue present medications.                           - Await pathology results.                           - Use Protonix (pantoprazole) 40 mg PO daily for 3                            months. Procedure Code(s):        --- Professional ---                           [phone], Esophagogastroduodenoscopy, flexible,                            transoral; with biopsy, single or multiple Diagnosis Code(s):        --- Professional ---  K21.00, Gastro-esophageal reflux disease with                            esophagitis, without bleeding                           K44.9, Diaphragmatic hernia without obstruction or                            gangrene                           K29.70, Gastritis, unspecified, without bleeding                           K31.89, Other diseases of stomach and duodenum                           K92.2, Gastrointestinal hemorrhage, unspecified                           D50.9, Iron deficiency anemia, unspecified CPT copyright 2019 American Medical Association. All rights reserved. The codes documented in this report are preliminary and upon coder review may  be revised to meet current compliance requirements. Mauri Pole, MD 06/16/2022 3:03:25 PM This report has been signed electronically. Number of Addenda: 0

## 2022-06-16 NOTE — Anesthesia Preprocedure Evaluation (Addendum)
Anesthesia Evaluation  Patient identified by MRN, date of birth, ID band Patient awake    Reviewed: Allergy & Precautions, NPO status , Patient's Chart, lab work & pertinent test results  Airway Mallampati: II  TM Distance: >3 FB Neck ROM: Full    Dental  (+) Dental Advisory Given, Loose, Poor Dentition,    Pulmonary neg pulmonary ROS,    Pulmonary exam normal breath sounds clear to auscultation       Cardiovascular hypertension, Pt. on medications Normal cardiovascular exam Rhythm:Regular Rate:Normal     Neuro/Psych Brain aneurysm s/p VP shunt    GI/Hepatic negative GI ROS, Neg liver ROS,   Endo/Other  negative endocrine ROS  Renal/GU negative Renal ROS     Musculoskeletal negative musculoskeletal ROS (+)   Abdominal   Peds  Hematology  (+) Blood dyscrasia, anemia ,   Anesthesia Other Findings Day of surgery medications reviewed with the patient.  Reproductive/Obstetrics                            Anesthesia Physical Anesthesia Plan  ASA: 3  Anesthesia Plan: MAC   Post-op Pain Management:    Induction: Intravenous  PONV Risk Score and Plan: 1 and Treatment may vary due to age or medical condition and TIVA  Airway Management Planned: Nasal Cannula  Additional Equipment:   Intra-op Plan:   Post-operative Plan:   Informed Consent: I have reviewed the patients History and Physical, chart, labs and discussed the procedure including the risks, benefits and alternatives for the proposed anesthesia with the patient or authorized representative who has indicated his/her understanding and acceptance.     Dental advisory given  Plan Discussed with: CRNA and Anesthesiologist  Anesthesia Plan Comments:         Anesthesia Quick Evaluation

## 2022-06-16 NOTE — H&P (Signed)
[facility] History and Physical   Primary Care Physician:  Lindaann Pascal, PA-C   Reason for Procedure:   Heme positive stool  Plan:    EGD and colonoscopy with possible interventions     HPI: Wesley Webb is a 76 y.o. male here for EGD and colonoscopy for evaluation of heme positive stool and iron deficiency anemia.  The risks and benefits as well as alternatives of endoscopic procedure(s) have been discussed and reviewed. All questions answered. The patient agrees to proceed.   Past Medical History:  Diagnosis Date   Anemia    Brain aneurysm    Cognitive decline    Hypertension    Infestation by bed bug     Past Surgical History:  Procedure Laterality Date   SHOULDER SURGERY     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      Prior to Admission medications   Medication Sig Start Date End Date Taking? Authorizing Provider  amLODipine (NORVASC) 10 MG tablet amlodipine 10 mg tablet  Take 1 tablet every day by oral route.    [provider]  COMBIGAN 0.2-0.5 % ophthalmic solution  02/21/19   [provider]  diclofenac Sodium (VOLTAREN) 1 % GEL Apply 2 g topically 4 (four) times daily. 08/16/21   Arnette Felts, FNP  hydrochlorothiazide (HYDRODIURIL) 25 MG tablet Take 1 tablet by mouth once daily 04/05/22   Arnette Felts, FNP  hydrocortisone cream 1 % APPLY  CREAM EXTERNALLY TO AFFECTED AREA TWICE DAILY 11/01/21   Arnette Felts, FNP  hydrOXYzine (ATARAX) 25 MG tablet hydroxyzine HCl 25 mg tablet    [provider]  ibuprofen (ADVIL) 800 MG tablet TAKE 1 TABLET BY MOUTH EVERY 8 HOURS AS NEEDED 11/23/20   Arnette Felts, FNP  LUMIGAN 0.01 % SOLN INSTILL 1 DROP INTO [facility] EVERY EVENING 03/06/19   [provider]    No current facility-administered medications for this encounter.    Allergies as of 06/13/2022   (No Known Allergies)    Family History  Problem Relation Age of Onset   Glaucoma Mother    Hypertension Mother    Hypertension  Father    Cancer Father    Alcohol abuse Father    Kidney disease Brother     Social History   Socioeconomic History   Marital status: Married    Spouse name: Not on file   Number of children: Not on file   Years of education: Not on file   Highest education level: Not on file  Occupational History   Occupation: works part time security  Tobacco Use   Smoking status: Never   Smokeless tobacco: Never  Vaping Use   Vaping Use: Never used  Substance and Sexual Activity   Alcohol use: No   Drug use: No   Sexual activity: Yes  Other Topics Concern   Not on file  Social History Narrative   Not on file   Social Determinants of Health   Financial Resource Strain: Low Risk  (04/15/2021)   Overall Financial Resource Strain (CARDIA)    Difficulty of Paying Living Expenses: Not hard at all  Food Insecurity: No Food Insecurity (10/05/2021)   Hunger Vital Sign    Worried About Running Out of Food in the Last Year: Never true    Ran Out of Food in the Last Year: Never true  Transportation Needs: No Transportation Needs (10/05/2021)   PRAPARE - Transportation    Lack of Transportation (Medical): No    Lack of  Transportation (Non-Medical): No  Physical Activity: Inactive (04/15/2021)   Exercise Vital Sign    Days of Exercise per Week: 0 days    Minutes of Exercise per Session: 0 min  Stress: No Stress Concern Present (04/15/2021)   [employer] of Occupational Health - Occupational Stress Questionnaire    Feeling of Stress : Not at all  Social Connections: Not on file  Intimate Partner Violence: Not At Risk (03/14/2019)   Humiliation, Afraid, Rape, and Kick questionnaire    Fear of Current or Ex-Partner: No    Emotionally Abused: No    Physically Abused: No    Sexually Abused: No    Review of Systems:  All other review of systems negative except as mentioned in the HPI.  Physical Exam: Vital signs in last 24 hours:    BP 139/79   Pulse (!) 54   Temp (!) 97.4 F  (36.3 C) (Temporal)   Resp 16   Ht 5\' 10"  (1.778 m)   Wt 97.1 kg   SpO2 98%   BMI 30.72 kg/m   General:   Alert, NAD Lungs:  Clear .   Heart:  Regular rate and rhythm Abdomen:  Soft, nontender and nondistended. Neuro/Psych:  Alert and cooperative. Normal mood and affect. A and O x 3   K. , MD [phone]

## 2022-06-16 NOTE — Transfer of Care (Signed)
Immediate Anesthesia Transfer of Care Note  Patient: Wesley Webb  Procedure(s) Performed: ESOPHAGOGASTRODUODENOSCOPY (EGD) WITH PROPOFOL COLONOSCOPY WITH PROPOFOL BIOPSY  Patient Location: PACU  Anesthesia Type:MAC  Level of Consciousness: sedated  Airway & Oxygen Therapy: Patient Spontanous Breathing and Patient connected to face mask oxygen  Post-op Assessment: Report given to RN and Post -op Vital signs reviewed and stable  Post vital signs: Reviewed and stable  Last Vitals:  Vitals Value Taken Time  BP    Temp    Pulse    Resp    SpO2      Last Pain:  Vitals:   06/16/22 1332  TempSrc: Temporal  PainSc: 0-No pain         Complications: No notable events documented.

## 2022-06-16 NOTE — Op Note (Signed)
[facility] Patient Name: Wesley Webb Procedure Date: 06/16/2022 MRN: [id number] Attending MD: Napoleon Form , MD Date of Birth: [birth year] CSN: [id number] Age: 76 Admit Type: Outpatient Procedure:                Colonoscopy Indications:              Evaluation of unexplained GI bleeding presenting                            with fecal occult blood, Unexplained iron                            deficiency anemia Providers:                Napoleon Form, MD, Lorenza Evangelist, RN, Beryle Beams, Technician, Marlena Clipper, CRNA Referring MD:              Medicines:                Monitored Anesthesia Care Complications:            No immediate complications. Estimated Blood Loss:     Estimated blood loss was minimal. Procedure:                Pre-Anesthesia Assessment:                           - Prior to the procedure, a History and Physical                            was performed, and patient medications and                            allergies were reviewed. The patient's tolerance of                            previous anesthesia was also reviewed. The risks                            and benefits of the procedure and the sedation                            options and risks were discussed with the patient.                            All questions were answered, and informed consent                            was obtained. Prior Anticoagulants: The patient has                            taken no previous anticoagulant or antiplatelet                            agents.  ASA Grade Assessment: III - A patient with                            severe systemic disease. After reviewing the risks                            and benefits, the patient was deemed in                            satisfactory condition to undergo the procedure.                           After obtaining informed consent, the colonoscope                             was passed under direct vision. Throughout the                            procedure, the patient's blood pressure, pulse, and                            oxygen saturations were monitored continuously. The                            [id number]-HQ190L ([id number]) Olympus colonoscope was                            introduced through the anus and advanced to the the                            cecum, identified by appendiceal orifice and                            ileocecal valve. The colonoscopy was performed                            without difficulty. The patient tolerated the                            procedure well. The quality of the bowel                            preparation was excellent. The ileocecal valve,                            appendiceal orifice, and rectum were photographed. Scope In: 2:36:09 PM Scope Out: 2:50:51 PM Scope Withdrawal Time: 0 hours 10 minutes 5 seconds  Total Procedure Duration: 0 hours 14 minutes 42 seconds  Findings:      The perianal and digital rectal examinations were normal.      Non-bleeding external and internal hemorrhoids were found during       retroflexion. The hemorrhoids were large.      The exam was otherwise without abnormality. Impression:               -  Non-bleeding external and internal hemorrhoids.                           - The examination was otherwise normal.                           - No specimens collected. Moderate Sedation:      Not Applicable - Patient had care per Anesthesia. Recommendation:           - Patient has a contact number available for                            emergencies. The signs and symptoms of potential                            delayed complications were discussed with the                            patient. Return to normal activities tomorrow.                            Written discharge instructions were provided to the                            patient.                           - Resume previous diet.                            - Continue present medications.                           - No repeat colonoscopy due to age. Procedure Code(s):        --- Professional ---                           [phone], Colonoscopy, flexible; diagnostic, including                            collection of specimen(s) by brushing or washing,                            when performed (separate procedure) Diagnosis Code(s):        --- Professional ---                           K64.8, Other hemorrhoids                           R19.5, Other fecal abnormalities                           D50.9, Iron deficiency anemia, unspecified CPT copyright 2019 American Medical Association. All rights reserved. The codes documented in this report are preliminary and upon coder review may  be revised to meet current compliance requirements. Napoleon Form,  MD 06/16/2022 2:55:57 PM This report has been signed electronically. Number of Addenda: 0

## 2022-06-17 LAB — SURGICAL PATHOLOGY

## 2022-09-12 ENCOUNTER — Other Ambulatory Visit: Payer: Self-pay

## 2022-09-12 ENCOUNTER — Telehealth: Payer: Self-pay | Admitting: Gastroenterology

## 2022-09-12 MED ORDER — BISMUTH/METRONIDAZ/TETRACYCLIN 140-125-125 MG PO CAPS: 3.0000 | ORAL_CAPSULE | Freq: Three times a day (TID) | ORAL | 0 refills | Status: DC

## 2022-09-12 NOTE — Telephone Encounter (Signed)
Spoke with Mr Wesley Webb and discussed the findings. Will start with Pylera to see if covered by insurance. Follow up by phone with the findings. Change medication if not covered.

## 2022-09-12 NOTE — Telephone Encounter (Signed)
Patient's son-in law returned your call. Requesting a call back as soon as possible. Please call to advise.

## 2022-09-12 NOTE — Telephone Encounter (Signed)
-----   Message from Mauri Pole, MD sent at 09/01/2022 10:37 AM EDT ----- H.pylori positive.   Please send prescription for Pylera X 10 days and PPI BID, if not covered by insurance send Rx for Bismuth 524 mg four times daily, Flagyl 250mg  1 tablet four times daily, Doxycycline 100mg  capsule Twice daily X 10 days along with PPI BID (Lansoprazole, Omeprazole or Nexium).  Will need to confirm eradication in 6 weeks by checking H.pylori stool Ag, off PPI for 2 weeks prior to the test.   Please inform patient the results. Thanks

## 2022-09-13 ENCOUNTER — Other Ambulatory Visit: Payer: Self-pay

## 2022-09-13 MED ORDER — BISMUTH SUBSALICYLATE 262 MG PO TABS: 2.0000 | ORAL_TABLET | Freq: Four times a day (QID) | ORAL | 0 refills | Status: DC

## 2022-09-13 MED ORDER — DOXYCYCLINE HYCLATE 100 MG PO CAPS: 100.0000 mg | ORAL_CAPSULE | Freq: Two times a day (BID) | ORAL | 0 refills | Status: DC

## 2022-09-13 MED ORDER — METRONIDAZOLE 250 MG PO TABS: 250.0000 mg | ORAL_TABLET | Freq: Four times a day (QID) | ORAL | 0 refills | Status: DC

## 2022-09-13 MED ORDER — DOXYCYCLINE HYCLATE 100 MG PO CAPS: 100.0000 mg | ORAL_CAPSULE | Freq: Two times a day (BID) | ORAL | 0 refills | Status: AC

## 2022-09-13 MED ORDER — BISMUTH SUBSALICYLATE 262 MG PO TABS: 2.0000 | ORAL_TABLET | Freq: Four times a day (QID) | ORAL | 0 refills | Status: AC

## 2022-09-13 MED ORDER — METRONIDAZOLE 250 MG PO TABS: 250.0000 mg | ORAL_TABLET | Freq: Four times a day (QID) | ORAL | 0 refills | Status: AC

## 2022-09-13 NOTE — Telephone Encounter (Signed)
Per pharmacist/tech, the Pylera will cost $180.00. Canceled the prescription for Pylera.

## 2022-09-14 NOTE — Telephone Encounter (Signed)
Called Mr Wesley Webb. No answer. Left a voicemail asking he call me. We need to review the treatment for H Pylori .  Bismuth 524 mg four times daily,  Flagyl 250mg  1 tablet four times daily,  Doxycycline 100mg  capsule Twice daily Increase the Pantoprazole to twice daily All medications for  X 10 days  On day 11, stop the pantoprazole. After he has been off pantoprazole 14 days, do the stool test for H Pylori to confirm eradication. Order is in lab. He may pick up the stool collection supplies at any time.

## 2022-09-15 ENCOUNTER — Telehealth: Payer: Self-pay | Admitting: Gastroenterology

## 2022-09-15 NOTE — Telephone Encounter (Signed)
Mr Wesley Webb called, states he would like with you. Please call to advise.

## 2022-09-16 NOTE — Telephone Encounter (Signed)
Spoke with Mr Wesley Webb. Reviewed plan of treatment. Questions invited. None at this time.

## 2022-09-21 ENCOUNTER — Encounter: Payer: Medicare HMO | Admitting: Nurse Practitioner

## 2022-10-26 DIAGNOSIS — H47233 Glaucomatous optic atrophy, bilateral: Secondary | ICD-10-CM | POA: Diagnosis not present

## 2022-10-26 DIAGNOSIS — H401133 Primary open-angle glaucoma, bilateral, severe stage: Secondary | ICD-10-CM | POA: Diagnosis not present

## 2023-03-27 ENCOUNTER — Encounter (HOSPITAL_COMMUNITY): Payer: Self-pay

## 2023-03-27 ENCOUNTER — Other Ambulatory Visit: Payer: Self-pay

## 2023-03-27 ENCOUNTER — Emergency Department (HOSPITAL_COMMUNITY)
Admission: EM | Admit: 2023-03-27 | Discharge: 2023-03-27 | Disposition: A | Discharge status: Home / Self Care | Payer: Medicare PPO | Attending: Emergency Medicine | Admitting: Emergency Medicine

## 2023-03-27 DIAGNOSIS — R32 Unspecified urinary incontinence: Secondary | ICD-10-CM | POA: Insufficient documentation

## 2023-03-27 DIAGNOSIS — Z79899 Other long term (current) drug therapy: Secondary | ICD-10-CM | POA: Diagnosis not present

## 2023-03-27 DIAGNOSIS — D649 Anemia, unspecified: Secondary | ICD-10-CM | POA: Diagnosis not present

## 2023-03-27 DIAGNOSIS — I10 Essential (primary) hypertension: Secondary | ICD-10-CM | POA: Diagnosis not present

## 2023-03-27 DIAGNOSIS — R21 Rash and other nonspecific skin eruption: Secondary | ICD-10-CM | POA: Insufficient documentation

## 2023-03-27 DIAGNOSIS — R4182 Altered mental status, unspecified: Secondary | ICD-10-CM | POA: Insufficient documentation

## 2023-03-27 DIAGNOSIS — R399 Unspecified symptoms and signs involving the genitourinary system: Secondary | ICD-10-CM

## 2023-03-27 DIAGNOSIS — R531 Weakness: Secondary | ICD-10-CM | POA: Diagnosis not present

## 2023-03-27 LAB — CBC
HCT: 35.7 % — ABNORMAL LOW (ref 39.0–52.0)
Hemoglobin: 11.4 g/dL — ABNORMAL LOW (ref 13.0–17.0)
MCH: 31.1 pg (ref 26.0–34.0)
MCHC: 31.9 g/dL (ref 30.0–36.0)
MCV: 97.5 fL (ref 80.0–100.0)
Platelets: 205 10*3/uL (ref 150–400)
RBC: 3.66 MIL/uL — ABNORMAL LOW (ref 4.22–5.81)
RDW: 12.9 % (ref 11.5–15.5)
WBC: 4.6 10*3/uL (ref 4.0–10.5)
nRBC: 0 % (ref 0.0–0.2)

## 2023-03-27 LAB — URINALYSIS, ROUTINE W REFLEX MICROSCOPIC
Bilirubin Urine: NEGATIVE
Glucose, UA: NEGATIVE mg/dL
Ketones, ur: NEGATIVE mg/dL
Nitrite: NEGATIVE
Protein, ur: NEGATIVE mg/dL
Specific Gravity, Urine: 1.006 (ref 1.005–1.030)
pH: 5 (ref 5.0–8.0)

## 2023-03-27 LAB — COMPREHENSIVE METABOLIC PANEL
ALT: 12 U/L (ref 0–44)
AST: 16 U/L (ref 15–41)
Albumin: 3.1 g/dL — ABNORMAL LOW (ref 3.5–5.0)
Alkaline Phosphatase: 42 U/L (ref 38–126)
Anion gap: 6 (ref 5–15)
BUN: 14 mg/dL (ref 8–23)
CO2: 25 mmol/L (ref 22–32)
Calcium: 8.7 mg/dL — ABNORMAL LOW (ref 8.9–10.3)
Chloride: 103 mmol/L (ref 98–111)
Creatinine, Ser: 1.15 mg/dL (ref 0.61–1.24)
GFR, Estimated: >60 mL/min (ref >60)
Glucose, Bld: 130 mg/dL — ABNORMAL HIGH (ref 70–99)
Potassium: 3.6 mmol/L (ref 3.5–5.1)
Sodium: 134 mmol/L — ABNORMAL LOW (ref 135–145)
Total Bilirubin: 0.5 mg/dL (ref 0.3–1.2)
Total Protein: 7.5 g/dL (ref 6.5–8.1)

## 2023-03-27 LAB — CBG MONITORING, ED: Glucose-Capillary: 135 mg/dL — ABNORMAL HIGH (ref 70–99)

## 2023-03-27 NOTE — ED Notes (Signed)
Pt's sister [name] updated on discharge instructions. Pt wheeled to lobby, both the patient and [name] instructed to wave over to [place] (ED tech) to help assist pt getting wheeled out to the car when their other sister arrives.

## 2023-03-27 NOTE — Discharge Instructions (Addendum)
It was our pleasure to provide your ER care today - we hope that you feel better.  Drink plenty of fluids/stay well hydrated.  Follow up with primary care doctor in 1-2 weeks.   Return to ER if worse, new symptoms, fevers, new/severe pain, chest pain, trouble breathing, or other emergency concern.

## 2023-03-27 NOTE — ED Notes (Signed)
Pt taken for decontamination.

## 2023-03-27 NOTE — ED Notes (Signed)
Bedbug found on pt. Charge RN made aware.

## 2023-03-27 NOTE — ED Notes (Signed)
RN tried to get patient to urinate in the urinal. Pt unable to go. Pt hesitant about wanting a straight cath. This RN said she can leave the urinal between his legs for a bit longer to see if he can go

## 2023-03-27 NOTE — ED Provider Notes (Signed)
Signed out to d/c to home when UA resulted.   UA neg for uti.   Vitals stable. Pt comfortable appearing, no distress or current physical c/o.   Pt currently appears stable for d/c per Dr Wilma Flavin plan.      Lajean Saver, MD 03/27/23 1025

## 2023-03-27 NOTE — ED Provider Notes (Signed)
[facility] Provider Note  CSN: [id number] Arrival date & time: 03/27/23 [id number]  Chief Complaint(s) Altered Mental Status and Urinary Incontinence  HPI Wesley Webb is a 77 y.o. male with a past medical history listed below including hypertension, cognitive decline who presents to the emergency department for urinary incontinence and altered mental status.  EMS was called out by family.  Reports that the cognitive decline has been ongoing for several months but got worse this evening.  Patient denies any physical complaints.  He is oriented to person place time and recent events.  The history is provided by the EMS personnel.    Past Medical History Past Medical History:  Diagnosis Date   Anemia    Brain aneurysm    Cognitive decline    Hypertension    Infestation by bed bug    Patient Active Problem List   Diagnosis Date Noted   Heme positive stool    Iron deficiency anemia due to chronic blood loss    Urinary urgency 05/30/2019   Mixed hyperlipidemia 09/17/2018   Essential hypertension 09/17/2018   Home Medication(s) Prior to Admission medications   Medication Sig Start Date End Date Taking? Authorizing Provider  amLODipine (NORVASC) 2.5 MG tablet Take 2.5 mg by mouth daily. 02/20/23   [provider]  brimonidine (ALPHAGAN P) 0.1 % SOLN Place 1 drop into both eyes in the morning and at bedtime.    [provider]  olmesartan (BENICAR) 20 MG tablet Take 20 mg by mouth daily. 02/20/23   [provider]  pantoprazole (PROTONIX) 40 MG tablet Take 1 tablet (40 mg total) by mouth daily. 06/16/22 06/16/23  Mauri Pole, MD  timolol (TIMOPTIC) 0.5 % ophthalmic solution Place 1 drop into both eyes 2 (two) times daily. 11/28/22   [provider]                                                                                                                                    Allergies Patient has no  known allergies.  Review of Systems Review of Systems As noted in HPI  Physical Exam Vital Signs  I have reviewed the triage vital signs BP (!) 172/86 (BP Location: Right Arm)   Pulse 75   Temp 98.2 F (36.8 C) (Oral)   Resp 17   SpO2 97%   Physical Exam Vitals reviewed.  Constitutional:      General: He is not in acute distress.    Appearance: He is well-developed. He is not diaphoretic.  HENT:     Head: Normocephalic and atraumatic.     Nose: Nose normal.  Eyes:     General: No scleral icterus.       Right eye: No discharge.        Left eye: No discharge.     Conjunctiva/sclera: Conjunctivae normal.     Pupils: Pupils are equal,  round, and reactive to light.  Cardiovascular:     Rate and Rhythm: Normal rate and regular rhythm.     Heart sounds: No murmur heard.    No friction rub. No gallop.  Pulmonary:     Effort: Pulmonary effort is normal. No respiratory distress.     Breath sounds: Normal breath sounds. No stridor. No rales.  Abdominal:     General: There is no distension.     Palpations: Abdomen is soft.     Tenderness: There is no abdominal tenderness.  Musculoskeletal:        General: No tenderness.     Cervical back: Normal range of motion and neck supple.  Skin:    General: Skin is warm and dry.     Findings: Rash (exzematous) present. No erythema.  Neurological:     Mental Status: He is alert and oriented to person, place, and time.     Sensory: Sensation is intact.     Motor: Motor function is intact.     ED Results and Treatments Labs (all labs ordered are listed, but only abnormal results are displayed) Labs Reviewed  COMPREHENSIVE METABOLIC PANEL - Abnormal; Notable for the following components:      Result Value   Sodium 134 (*)    Glucose, Bld 130 (*)    Calcium 8.7 (*)    Albumin 3.1 (*)    All other components within normal limits  CBC - Abnormal; Notable for the following components:   RBC 3.66 (*)    Hemoglobin 11.4 (*)    HCT  35.7 (*)    All other components within normal limits  CBG MONITORING, ED - Abnormal; Notable for the following components:   Glucose-Capillary 135 (*)    All other components within normal limits  URINALYSIS, ROUTINE W REFLEX MICROSCOPIC                                                                                                                         EKG  EKG Interpretation  Date/Time:    Ventricular Rate:    PR Interval:    QRS Duration:   QT Interval:    QTC Calculation:   R Axis:     Text Interpretation:         Radiology No results found.  Medications Ordered in ED Medications - No data to display  Procedures Procedures  (including critical care time)  Medical Decision Making / ED Course  Click here for ABCD2, HEART and other calculators  Medical Decision Making Amount and/or Complexity of Data Reviewed Labs: ordered. Decision-making details documented in ED Course.    This patient presents to the ED for concern of urinary incontinence and confusion, this involves an extensive number of treatment options, and is a complaint that carries with it a high risk of complications and morbidity. The differential diagnosis includes but not limited to no evidence of trauma on exam or focal deficits concerning for ICH or CVA.  Will assess for any electrolyte or metabolic derangements.  Will also assess for urinary tract infection..  Initial intervention:  None needed  Work up: [employer] and/or imaging independently interpreted by me) CBC without leukocytosis.  Mild anemia. Metabolic panel without significant electrolyte derangements or renal insufficiency.  Reassessment: Remained stable Pending UA. Patient care turned over to oncoming provider. Patient case and results discussed in detail; please see their note for further ED managment.           Final Clinical Impression(s) / ED Diagnoses Final diagnoses:  None           This chart was dictated using voice recognition software.  Despite best efforts to proofread,  errors can occur which can change the documentation meaning.    Fatima Blank, MD 03/27/23 [phone]

## 2023-03-27 NOTE — ED Triage Notes (Signed)
PT BIB EMS from home with c/o worsening confusion and urinary incontinence over the last day. Baseline GCS 15. Pt hypertensive with EMS. EMS unable to obtain IV access.

## 2023-03-27 NOTE — ED Notes (Signed)
Pt unable to void, pt now agreeable to a straight cath.

## 2023-03-30 ENCOUNTER — Telehealth: Payer: Self-pay

## 2023-03-30 NOTE — Telephone Encounter (Signed)
        Patient  visited [facility] on 4/1     Telephone encounter attempt :  1st  A HIPAA compliant voice message was left requesting a return call.  Instructed patient to call back.    [facility] [phone] [address], [place], [place] [address] Phone: [phone] Email: [email or web address]

## 2023-03-31 ENCOUNTER — Telehealth: Payer: Self-pay

## 2023-03-31 NOTE — Telephone Encounter (Signed)
        Patient  visited [facility] on 3/31   Telephone encounter attempt :  2nd  A HIPAA compliant voice message was left requesting a return call.  Instructed patient to call back .    Len[facility] For[facility], [facility] [phone] [address] [place], [place], [place] [address] Phone: [phone] Email: [email or web address]

## 2023-04-27 ENCOUNTER — Ambulatory Visit: Payer: Medicare PPO | Admitting: Nurse Practitioner

## 2023-06-16 ENCOUNTER — Emergency Department (HOSPITAL_BASED_OUTPATIENT_CLINIC_OR_DEPARTMENT_OTHER): Payer: Medicare PPO

## 2023-06-16 ENCOUNTER — Encounter (HOSPITAL_BASED_OUTPATIENT_CLINIC_OR_DEPARTMENT_OTHER): Payer: Self-pay | Admitting: Emergency Medicine

## 2023-06-16 ENCOUNTER — Emergency Department (HOSPITAL_BASED_OUTPATIENT_CLINIC_OR_DEPARTMENT_OTHER)
Admission: EM | Admit: 2023-06-16 | Discharge: 2023-06-16 | Disposition: A | Discharge status: Home / Self Care | Payer: Medicare PPO | Attending: Emergency Medicine | Admitting: Emergency Medicine

## 2023-06-16 ENCOUNTER — Other Ambulatory Visit: Payer: Self-pay

## 2023-06-16 DIAGNOSIS — R634 Abnormal weight loss: Secondary | ICD-10-CM | POA: Diagnosis not present

## 2023-06-16 DIAGNOSIS — R011 Cardiac murmur, unspecified: Secondary | ICD-10-CM | POA: Diagnosis not present

## 2023-06-16 DIAGNOSIS — R9431 Abnormal electrocardiogram [ECG] [EKG]: Secondary | ICD-10-CM | POA: Diagnosis not present

## 2023-06-16 DIAGNOSIS — J9811 Atelectasis: Secondary | ICD-10-CM | POA: Diagnosis not present

## 2023-06-16 DIAGNOSIS — L209 Atopic dermatitis, unspecified: Secondary | ICD-10-CM | POA: Diagnosis not present

## 2023-06-16 DIAGNOSIS — I10 Essential (primary) hypertension: Secondary | ICD-10-CM | POA: Diagnosis not present

## 2023-06-16 DIAGNOSIS — R0789 Other chest pain: Secondary | ICD-10-CM | POA: Diagnosis not present

## 2023-06-16 DIAGNOSIS — Z125 Encounter for screening for malignant neoplasm of prostate: Secondary | ICD-10-CM | POA: Diagnosis not present

## 2023-06-16 DIAGNOSIS — Z0000 Encounter for general adult medical examination without abnormal findings: Secondary | ICD-10-CM | POA: Diagnosis not present

## 2023-06-16 DIAGNOSIS — I2129 ST elevation (STEMI) myocardial infarction involving other sites: Secondary | ICD-10-CM | POA: Diagnosis not present

## 2023-06-16 DIAGNOSIS — R739 Hyperglycemia, unspecified: Secondary | ICD-10-CM | POA: Diagnosis not present

## 2023-06-16 LAB — CBC WITH DIFFERENTIAL/PLATELET
Abs Immature Granulocytes: 0.01 10*3/uL (ref 0.00–0.07)
Basophils Absolute: 0 10*3/uL (ref 0.0–0.1)
Basophils Relative: 0 %
Eosinophils Absolute: 0.2 10*3/uL (ref 0.0–0.5)
Eosinophils Relative: 5 %
HCT: 34.7 % — ABNORMAL LOW (ref 39.0–52.0)
Hemoglobin: 11.9 g/dL — ABNORMAL LOW (ref 13.0–17.0)
Immature Granulocytes: 0 %
Lymphocytes Relative: 30 %
Lymphs Abs: 1.4 10*3/uL (ref 0.7–4.0)
MCH: 32 pg (ref 26.0–34.0)
MCHC: 34.3 g/dL (ref 30.0–36.0)
MCV: 93.3 fL (ref 80.0–100.0)
Monocytes Absolute: 0.5 10*3/uL (ref 0.1–1.0)
Monocytes Relative: 11 %
Neutro Abs: 2.4 10*3/uL (ref 1.7–7.7)
Neutrophils Relative %: 54 %
Platelets: 202 10*3/uL (ref 150–400)
RBC: 3.72 MIL/uL — ABNORMAL LOW (ref 4.22–5.81)
RDW: 12.2 % (ref 11.5–15.5)
WBC: 4.5 10*3/uL (ref 4.0–10.5)
nRBC: 0 % (ref 0.0–0.2)

## 2023-06-16 LAB — BASIC METABOLIC PANEL
Anion gap: 6 (ref 5–15)
BUN: 19 mg/dL (ref 8–23)
CO2: 29 mmol/L (ref 22–32)
Calcium: 9.4 mg/dL (ref 8.9–10.3)
Chloride: 102 mmol/L (ref 98–111)
Creatinine, Ser: 1.11 mg/dL (ref 0.61–1.24)
GFR, Estimated: >60 mL/min (ref >60)
Glucose, Bld: 159 mg/dL — ABNORMAL HIGH (ref 70–99)
Potassium: 4.1 mmol/L (ref 3.5–5.1)
Sodium: 137 mmol/L (ref 135–145)

## 2023-06-16 LAB — MAGNESIUM: Magnesium: 1.8 mg/dL (ref 1.7–2.4)

## 2023-06-16 NOTE — Discharge Instructions (Signed)
You have been seen today for your complaint of abnormal EKG. Your lab work was reassuring. Your imaging was reassuring. Follow up with: Cardiology.  They should call you within the next 3 business days.  If they do not, you may call the phone number listed in this packet. Please seek immediate medical care if you develop any of the following symptoms: Your chest pain is worse. You have a cough that gets worse, or you cough up blood. You have very bad (severe) pain in your belly (abdomen). You pass out (faint). You have either of these for no clear reason: Sudden chest discomfort. Sudden discomfort in your arms, back, neck, or jaw. You have shortness of breath at any time. You suddenly start to sweat, or your skin gets clammy. You feel sick to your stomach (nauseous). You throw up (vomit). You suddenly feel lightheaded or dizzy. You feel very weak or tired. Your heart starts to beat fast, or it feels like it is skipping beats. At this time there does not appear to be the presence of an emergent medical condition, however there is always the potential for conditions to change. Please read and follow the below instructions.  Do not take your medicine if  develop an itchy rash, swelling in your mouth or lips, or difficulty breathing; call 911 and seek immediate emergency medical attention if this occurs.  You may review your lab tests and imaging results in their entirety on your MyChart account.  Please discuss all results of fully with your primary care provider and other specialist at your follow-up visit.  Note: Portions of this text may have been transcribed using voice recognition software. Every effort was made to ensure accuracy; however, inadvertent computerized transcription errors may still be present.

## 2023-06-16 NOTE — ED Notes (Signed)
Discharge paperwork given and verbally understood. 

## 2023-06-16 NOTE — ED Provider Notes (Signed)
[facility] EMERGENCY DEPARTMENT AT [facility] Provider Note   CSN: [id number] Arrival date & time: 06/16/23  1551     History  Chief Complaint  Patient presents with   Abnormal ECG    Wesley Webb is a 77 y.o. male.  With a history of hypertension, anemia, brain aneurysm who presents to the ED for evaluation of an abnormal EKG.  He presented to [facility] urgent care for a routine checkup as that urgent care provider is also his primary care provider and had labs and an EKG ordered.  He was sent here due to an abnormal EKG.  He denies any symptoms.  Specifically denies chest pain, shortness of breath, leg swelling, nausea, vomiting, abdominal pain, headaches, numbness, weakness, tingling.  States he has never seen a cardiologist.  HPI     Home Medications Prior to Admission medications   Medication Sig Start Date End Date Taking? Authorizing Provider  amLODipine (NORVASC) 2.5 MG tablet Take 2.5 mg by mouth daily. 02/20/23   [provider]  brimonidine (ALPHAGAN P) 0.1 % SOLN Place 1 drop into both eyes in the morning and at bedtime.    [provider]  olmesartan (BENICAR) 20 MG tablet Take 20 mg by mouth daily. 02/20/23   [provider]  pantoprazole (PROTONIX) 40 MG tablet Take 1 tablet (40 mg total) by mouth daily. 06/16/22 06/16/23  Napoleon Form, MD  timolol (TIMOPTIC) 0.5 % ophthalmic solution Place 1 drop into both eyes 2 (two) times daily. 11/28/22   [provider]      Allergies    Patient has no known allergies.    Review of Systems   Review of Systems  All other systems reviewed and are negative.   Physical Exam Updated Vital Signs BP (!) 154/73   Pulse 80   Temp 98 F (36.7 C) (Oral)   Resp 20   Ht 5\' 10"  (1.778 m)   Wt 93 kg   SpO2 100%   BMI 29.41 kg/m  Physical Exam Vitals and nursing note reviewed.  Constitutional:      General: He is not in acute distress.    Appearance: He is well-developed.      Comments: Resting comfortably in bed  HENT:     Head: Normocephalic and atraumatic.  Eyes:     Conjunctiva/sclera: Conjunctivae normal.  Cardiovascular:     Rate and Rhythm: Normal rate. Rhythm irregular.     Heart sounds: No murmur heard. Pulmonary:     Effort: Pulmonary effort is normal. No respiratory distress.     Breath sounds: Normal breath sounds. No wheezing, rhonchi or rales.  Abdominal:     Palpations: Abdomen is soft.     Tenderness: There is no abdominal tenderness. There is no guarding.  Musculoskeletal:        General: No swelling.     Cervical back: Neck supple.     Right lower leg: No edema.     Left lower leg: No edema.  Skin:    General: Skin is warm and dry.     Capillary Refill: Capillary refill takes less than 2 seconds.  Neurological:     General: No focal deficit present.     Mental Status: He is alert and oriented to person, place, and time.  Psychiatric:        Mood and Affect: Mood normal.     ED Results / Procedures / Treatments   Labs (all labs ordered are listed, but only  abnormal results are displayed) Labs Reviewed  BASIC METABOLIC PANEL - Abnormal; Notable for the following components:      Result Value   Glucose, Bld 159 (*)    All other components within normal limits  CBC WITH DIFFERENTIAL/PLATELET - Abnormal; Notable for the following components:   RBC 3.72 (*)    Hemoglobin 11.9 (*)    HCT 34.7 (*)    All other components within normal limits  MAGNESIUM    EKG EKG Interpretation  Date/Time:  Friday June 16 2023 17:00:16 EDT Ventricular Rate:  89 PR Interval:  264 QRS Duration: 127 QT Interval:  409 QTC Calculation: 422 R Axis:   -53 Text Interpretation: sinus with PACs Nonspecific IVCD with LAD Left ventricular hypertrophy Confirmed by Meridee Score [phone]) on 06/16/2023 5:03:11 PM  Radiology DG Chest Port 1 View  Result Date: 06/16/2023 CLINICAL DATA:  Abnormal EKG EXAM: PORTABLE CHEST 1 VIEW COMPARISON:  None  Available. FINDINGS: Underinflation. There is some linear opacity at the bases likely scar or atelectasis. No consolidation, pneumothorax, effusion or edema. Normal cardiopericardial silhouette. Calcified, tortuous and ectatic aorta. Overlapping cardiac leads. IMPRESSION: Underinflation with some basilar atelectasis. Calcified and tortuous aorta. Electronically Signed   By: Karen Kays M.D.   On: 06/16/2023 17:27    Procedures Procedures    Medications Ordered in ED Medications - No data to display  ED Course/ Medical Decision Making/ A&P                             Medical Decision Making Amount and/or Complexity of Data Reviewed Labs: ordered. Radiology: ordered.  This patient presents to the ED for concern of abnormal EKG without symptoms, this involves an extensive number of treatment options, and is a complaint that carries with it a high risk of complications and morbidity.   Co morbidities that complicate the patient evaluation   hypertension, anemia, brain aneurysm  My initial workup includes EKG  Additional history obtained from: Nursing notes from this visit. Family daughter is at bedside and provides a portion of the history  I ordered, reviewed and interpreted labs which include: BMP, CBC, magnesium.  Hyperglycemia of 159.  Labs otherwise normal  I ordered imaging studies including chest x-ray I independently visualized and interpreted imaging which showed no acute cardiopulmonary abnormalities I agree with the radiologist interpretation  Afebrile, hemodynamically stable.  77 year old male presents to the ED for evaluation of an abnormal EKG found at an urgent care.  He has no symptoms.  He does have frequent PACs.  Lab workup reassuring.  Electrolytes within normal limits.  Chest x-ray without acute cardiopulmonary abnormalities.  Cardiology referral order was placed.  He was encouraged to return to the ED with new or worsening symptoms.  He was given return  precautions.  Stable at discharge.  At this time there does not appear to be any evidence of an acute emergency medical condition and the patient appears stable for discharge with appropriate outpatient follow up. Diagnosis was discussed with patient who verbalizes understanding of care plan and is agreeable to discharge. I have discussed return precautions with patient who verbalizes understanding. Patient encouraged to follow-up with their PCP within 1 week. All questions answered.  Patient's case discussed with Dr. Charm Barges who agrees with plan to discharge with follow-up.   Note: Portions of this report may have been transcribed using voice recognition software. Every effort was made to ensure accuracy; however, inadvertent computerized transcription  errors may still be present.        Final Clinical Impression(s) / ED Diagnoses Final diagnoses:  Abnormal EKG    Rx / DC Orders ED Discharge Orders          Ordered    Ambulatory referral to Cardiology       Comments: If you have not heard from the Cardiology office within the next 72 hours please call [phone].   06/16/23 1934              Mora Bellman 06/16/23 Desiree Hane, MD 06/17/23 1025

## 2023-06-16 NOTE — ED Triage Notes (Signed)
Pt arrives to ED with c/o abnormal EKG. Pt notes he went to UC today four a routine check-up and they sent him to the ED because his EKG was not normal. He denies any concerns or complaints at this time.

## 2023-06-22 ENCOUNTER — Ambulatory Visit (HOSPITAL_BASED_OUTPATIENT_CLINIC_OR_DEPARTMENT_OTHER): Payer: Medicare PPO | Admitting: Cardiology

## 2023-06-22 ENCOUNTER — Encounter (HOSPITAL_BASED_OUTPATIENT_CLINIC_OR_DEPARTMENT_OTHER): Payer: Self-pay | Admitting: Cardiology

## 2023-06-22 VITALS — BP 139/72 | HR 69 | Ht 70.0 in | Wt 207.0 lb

## 2023-06-22 DIAGNOSIS — Z712 Person consulting for explanation of examination or test findings: Secondary | ICD-10-CM | POA: Diagnosis not present

## 2023-06-22 DIAGNOSIS — I10 Essential (primary) hypertension: Secondary | ICD-10-CM

## 2023-06-22 DIAGNOSIS — Z7189 Other specified counseling: Secondary | ICD-10-CM | POA: Diagnosis not present

## 2023-06-22 DIAGNOSIS — R9431 Abnormal electrocardiogram [ECG] [EKG]: Secondary | ICD-10-CM

## 2023-06-22 DIAGNOSIS — Z9189 Other specified personal risk factors, not elsewhere classified: Secondary | ICD-10-CM | POA: Diagnosis not present

## 2023-06-22 NOTE — Patient Instructions (Signed)
Medication Instructions:  Continue current medications  *If you need a refill on your cardiac medications before your next appointment, please call your pharmacy*   Lab Work: None Ordered   Testing/Procedures: Your physician has requested that you have an echocardiogram. Echocardiography is a painless test that uses sound waves to create images of your heart. It provides your doctor with information about the size and shape of your heart and how well your heart's chambers and valves are working. This procedure takes approximately one hour. There are no restrictions for this procedure. Please do NOT wear cologne, perfume, aftershave, or lotions (deodorant is allowed). Please arrive 15 minutes prior to your appointment time.   Follow-Up: At [facility], you and your health needs are our priority.  As part of our continuing mission to provide you with exceptional heart care, we have created designated Provider Care Teams.  These Care Teams include your primary Cardiologist (physician) and Advanced Practice Providers (APPs -  Physician Assistants and Nurse Practitioners) who all work together to provide you with the care you need, when you need it.  We recommend signing up for the patient portal called "MyChart".  Sign up information is provided on this After Visit Summary.  MyChart is used to connect with patients for Virtual Visits (Telemedicine).  Patients are able to view lab/test results, encounter notes, upcoming appointments, etc.  Non-urgent messages can be sent to your provider as well.   To learn more about what you can do with MyChart, go to [email or web address].    Your next appointment:    To be determined based on result of Echocardiogram

## 2023-06-22 NOTE — Progress Notes (Signed)
Cardiology Office Note:  .    Date:  06/22/2023  ID:  Wende Bushy, DOB 12-Nov-1946, MRN [id number] PCP: Lindaann Pascal, PA-C   HeartCare Providers Cardiologist:  Jodelle Red, MD     History of Present Illness: .    Wesley Webb is a 77 y.o. male with a hx of hypertension, anemia, brain aneurysm, here for the evaluation of abnormal EKG.  On 06/16/2023 he had initially presented to [facility] urgent care for a routine checkup as the urgent care provider is also his PCP. Labs and EKG were completed. Subsequently sent to the ER due to abnormal EKG findings. In the ED, his EKG showed sinus rhythm at 89 bpm with PACs, nonspecific IVCD with LAD, LVH. He denied feeling symptomatic. Lab work notable for hyperglycemia 159, otherwise normal. Electrolytes within normal limits. Chest x-ray showed no acute cardiopulmonary abnormalities. He was discharged in stable condition with outpatient cardiology referral.  Today, he is accompanied by his daughter. He denies any issues with chest pain, palpitations, or syncope before or after his recent ED visit.   Cardiovascular risk factors: Prior clinical ASCVD: None. Comorbid conditions:  Hypertension - Has been diagnosed for a long time. In the office today his blood pressure is 152/69. Never told he has hyperlipidemia. No diabetes or CKD. Metabolic syndrome/Obesity:  Highest adult weight about 225 lbs. Current weight 207 lbs. Chronic inflammatory conditions:  None. Tobacco use history:  Never.  Family history:  He denies any family history of cardiovascular disease. Prior pertinent testing and/or incidental findings:  In the ED, his EKG showed sinus rhythm at 89 bpm with PACs, nonspecific IVCD with LAD, LVH. He confirms being symptomatic at the time. Exercise level:  Routine activities, he doesn't feel physically limited. He is able to climb stairs every day without issues. His daughter states that he appears to have slowed down and doesn't  seem to move as much as he should. On good days he may go on walks for a little bit. She states that he is limited by arthritis, especially of his back and hips, with occasional flare-ups that slow him down and disrupt his balance.   His daughter also mentions that he had a brain aneurysm many years ago. A few months ago she had taken him to the ER as he seemed delusional.  He denies any palpitations, chest pain, shortness of breath, peripheral edema, lightheadedness, headaches, syncope, orthopnea, or PND.  ROS:  Please see the history of present illness. ROS otherwise negative except as noted.   Studies Reviewed: Marland Kitchen       ECGs from 06/16/23 reviewed. SR with PACs, LVH     Physical Exam:    VS:  BP 139/72 (BP Location: Left Arm, Patient Position: Sitting, Cuff Size: Large)   Pulse 69   Ht 5\' 10"  (1.778 m)   Wt 207 lb (93.9 kg)   BMI 29.70 kg/m    Wt Readings from Last 3 Encounters:  06/22/23 207 lb (93.9 kg)  06/16/23 205 lb (93 kg)  06/16/22 214 lb 1.1 oz (97.1 kg)    GEN: Well nourished, well developed in no acute distress HEENT: Normal, moist mucous membranes NECK: No JVD CARDIAC: regular rhythm with occasional early beats, normal S1 and S2, no rubs or gallops. No murmur. VASCULAR: Radial and DP pulses 2+ bilaterally. No carotid bruits RESPIRATORY:  Clear to auscultation without rales, wheezing or rhonchi  ABDOMEN: Soft, non-tender, non-distended MUSCULOSKELETAL:  Ambulates independently SKIN: Warm and dry, no edema NEUROLOGIC:  Alert and oriented x 3. No focal neuro deficits noted. PSYCHIATRIC:  Normal affect   ASSESSMENT AND PLAN: .    Abnormal ECG -no high risk features -no symptoms -echo to exclude structural abnormalities  Hypertension -at goal, continue amlodipine and olmesartan  CV risk counseling and prevention: elevated ASCVD risk but over age 34 -recommend heart healthy/Mediterranean diet, with whole grains, fruits, vegetable, fish, lean meats, nuts, and  olive oil. Limit salt. -recommend moderate walking, 3-5 times/week for 30-50 minutes each session. Aim for at least 150 minutes.week. Goal should be pace of 3 miles/hours, or walking 1.5 miles in 30 minutes -recommend avoidance of tobacco products. Avoid excess alcohol. -ASCVD risk score: The 10-year ASCVD risk score (Arnett DK, et al., 2019) is: 25.2%   Values used to calculate the score:     Age: 17 years     Sex: Male     Is Non-Hispanic African American: Yes     Diabetic: No     Tobacco smoker: No     Systolic Blood Pressure: 139 mmHg     Is BP treated: Yes     HDL Cholesterol: 37 mg/dL     Total Cholesterol: 130 mg/dL    Dispo: Follow-up TBD based on results of testing, or sooner as needed.  I,Mathew Stumpf,acting as a Neurosurgeon for [employer], MD.,have documented all relevant documentation on the behalf of Jodelle Red, MD,as directed by  Jodelle Red, MD while in the presence of Jodelle Red, MD.  I, Jodelle Red, MD, have reviewed all documentation for this visit. The documentation on 06/22/23 for the exam, diagnosis, procedures, and orders are all accurate and complete.   Signed, Jodelle Red, MD

## 2023-06-28 ENCOUNTER — Telehealth (HOSPITAL_BASED_OUTPATIENT_CLINIC_OR_DEPARTMENT_OTHER): Payer: Self-pay | Admitting: Cardiology

## 2023-06-28 NOTE — Telephone Encounter (Signed)
Left message for patient to call and discuss rescheduling the Echocardiogram ordered by Dr. Christopher 

## 2023-07-04 DIAGNOSIS — H47233 Glaucomatous optic atrophy, bilateral: Secondary | ICD-10-CM | POA: Diagnosis not present

## 2023-07-04 DIAGNOSIS — H401133 Primary open-angle glaucoma, bilateral, severe stage: Secondary | ICD-10-CM | POA: Diagnosis not present

## 2023-07-28 ENCOUNTER — Other Ambulatory Visit (HOSPITAL_BASED_OUTPATIENT_CLINIC_OR_DEPARTMENT_OTHER): Payer: Medicare PPO

## 2023-08-17 DIAGNOSIS — L280 Lichen simplex chronicus: Secondary | ICD-10-CM | POA: Diagnosis not present

## 2023-08-17 DIAGNOSIS — L853 Xerosis cutis: Secondary | ICD-10-CM | POA: Diagnosis not present

## 2023-08-18 ENCOUNTER — Ambulatory Visit (HOSPITAL_BASED_OUTPATIENT_CLINIC_OR_DEPARTMENT_OTHER): Payer: Medicare PPO

## 2023-08-18 DIAGNOSIS — R9431 Abnormal electrocardiogram [ECG] [EKG]: Secondary | ICD-10-CM

## 2023-08-18 LAB — ECHOCARDIOGRAM COMPLETE
AR max vel: 2.28 cm2
AV Area VTI: 1.78 cm2
AV Area mean vel: 2.33 cm2
AV Mean grad: 4 mmHg
AV Peak grad: 7.8 mmHg
AV Vena cont: 0.27 cm
Ao pk vel: 1.4 m/s
Area-P 1/2: 2.92 cm2
P 1/2 time: 638 msec
S' Lateral: 3.64 cm

## 2023-08-24 DIAGNOSIS — H43813 Vitreous degeneration, bilateral: Secondary | ICD-10-CM | POA: Diagnosis not present

## 2023-08-24 DIAGNOSIS — H47233 Glaucomatous optic atrophy, bilateral: Secondary | ICD-10-CM | POA: Diagnosis not present

## 2023-08-24 DIAGNOSIS — H401133 Primary open-angle glaucoma, bilateral, severe stage: Secondary | ICD-10-CM | POA: Diagnosis not present

## 2023-08-24 DIAGNOSIS — H50111 Monocular exotropia, right eye: Secondary | ICD-10-CM | POA: Diagnosis not present

## 2023-09-11 ENCOUNTER — Other Ambulatory Visit (HOSPITAL_BASED_OUTPATIENT_CLINIC_OR_DEPARTMENT_OTHER): Payer: Self-pay | Admitting: Cardiology

## 2023-09-11 DIAGNOSIS — I351 Nonrheumatic aortic (valve) insufficiency: Secondary | ICD-10-CM

## 2023-09-11 DIAGNOSIS — I77810 Thoracic aortic ectasia: Secondary | ICD-10-CM

## 2023-10-19 DIAGNOSIS — H50111 Monocular exotropia, right eye: Secondary | ICD-10-CM | POA: Diagnosis not present

## 2023-10-19 DIAGNOSIS — H43813 Vitreous degeneration, bilateral: Secondary | ICD-10-CM | POA: Diagnosis not present

## 2023-10-19 DIAGNOSIS — H47233 Glaucomatous optic atrophy, bilateral: Secondary | ICD-10-CM | POA: Diagnosis not present

## 2023-10-19 DIAGNOSIS — H401133 Primary open-angle glaucoma, bilateral, severe stage: Secondary | ICD-10-CM | POA: Diagnosis not present

## 2024-04-07 DIAGNOSIS — R635 Abnormal weight gain: Secondary | ICD-10-CM | POA: Diagnosis not present

## 2024-04-07 DIAGNOSIS — R9431 Abnormal electrocardiogram [ECG] [EKG]: Secondary | ICD-10-CM | POA: Diagnosis not present

## 2024-04-07 DIAGNOSIS — I499 Cardiac arrhythmia, unspecified: Secondary | ICD-10-CM | POA: Diagnosis not present

## 2024-04-07 DIAGNOSIS — I10 Essential (primary) hypertension: Secondary | ICD-10-CM | POA: Diagnosis not present

## 2024-04-07 DIAGNOSIS — R609 Edema, unspecified: Secondary | ICD-10-CM | POA: Diagnosis not present

## 2024-06-15 DIAGNOSIS — I872 Venous insufficiency (chronic) (peripheral): Secondary | ICD-10-CM | POA: Diagnosis not present

## 2024-06-15 DIAGNOSIS — I87399 Chronic venous hypertension (idiopathic) with other complications of unspecified lower extremity: Secondary | ICD-10-CM | POA: Diagnosis not present

## 2024-06-15 DIAGNOSIS — L03115 Cellulitis of right lower limb: Secondary | ICD-10-CM | POA: Diagnosis not present

## 2024-07-16 ENCOUNTER — Encounter (HOSPITAL_BASED_OUTPATIENT_CLINIC_OR_DEPARTMENT_OTHER): Payer: Self-pay | Admitting: Cardiology

## 2024-07-22 ENCOUNTER — Encounter: Attending: Physician Assistant | Admitting: Physician Assistant

## 2024-07-22 DIAGNOSIS — D500 Iron deficiency anemia secondary to blood loss (chronic): Secondary | ICD-10-CM | POA: Diagnosis not present

## 2024-07-22 DIAGNOSIS — H5440 Blindness, one eye, unspecified eye: Secondary | ICD-10-CM | POA: Insufficient documentation

## 2024-07-22 DIAGNOSIS — I890 Lymphedema, not elsewhere classified: Secondary | ICD-10-CM | POA: Insufficient documentation

## 2024-07-22 DIAGNOSIS — I10 Essential (primary) hypertension: Secondary | ICD-10-CM | POA: Insufficient documentation

## 2024-08-08 ENCOUNTER — Encounter: Attending: Physician Assistant | Admitting: Physician Assistant

## 2024-08-08 DIAGNOSIS — Z09 Encounter for follow-up examination after completed treatment for conditions other than malignant neoplasm: Secondary | ICD-10-CM | POA: Diagnosis not present

## 2024-08-08 DIAGNOSIS — I10 Essential (primary) hypertension: Secondary | ICD-10-CM | POA: Insufficient documentation

## 2024-08-08 DIAGNOSIS — D500 Iron deficiency anemia secondary to blood loss (chronic): Secondary | ICD-10-CM | POA: Insufficient documentation

## 2024-08-08 DIAGNOSIS — H5440 Blindness, one eye, unspecified eye: Secondary | ICD-10-CM | POA: Diagnosis not present

## 2024-08-08 DIAGNOSIS — I890 Lymphedema, not elsewhere classified: Secondary | ICD-10-CM | POA: Diagnosis not present

## 2024-12-03 NOTE — Progress Notes (Signed)
 Wesley Webb                                          MRN: [id number]   12/03/2024   The [facility] Quality Team Specialist reviewed this patient medical record for the purposes of chart review for care gap closure. The following were reviewed: chart review for care gap closure-controlling blood pressure.    [facility]BCI Quality Team

## 2024-12-10 ENCOUNTER — Telehealth: Payer: Self-pay | Admitting: Pharmacist

## 2024-12-10 NOTE — Progress Notes (Unsigned)
 Patient ID: Wesley Webb, male   DOB: April 25, 1946, 77 y.o.   MRN: [id number]  Pharmacy Quality Measure Review  This patient is appearing on a report for being at risk of failing the adherence measure for hypertension (ACEi/ARB) medications this calendar year.   Medication: Olmesartan  Last fill date: 07/22/24 for 90 day supply  {adherenceinterventions:31257}

## 2025-03-07 ENCOUNTER — Ambulatory Visit (HOSPITAL_BASED_OUTPATIENT_CLINIC_OR_DEPARTMENT_OTHER): Admitting: Cardiology
